# Patient Record
Sex: Male | Born: 1975 | Race: White | Hispanic: No | Marital: Married | Smoking: Never smoker
Health system: Southern US, Community
[De-identification: ages and names within clinical notes are randomized; demographics above are authoritative.]

## PROBLEM LIST (undated history)

## (undated) DIAGNOSIS — F909 Attention-deficit hyperactivity disorder, unspecified type: Secondary | ICD-10-CM

---

## 2007-10-07 ENCOUNTER — Encounter: Admission: RE | Admit: 2007-10-07 | Discharge: 2007-10-07 | Payer: Self-pay | Admitting: Family Medicine

## 2015-10-30 ENCOUNTER — Encounter (HOSPITAL_COMMUNITY): Payer: Self-pay | Admitting: Emergency Medicine

## 2015-10-30 ENCOUNTER — Emergency Department (HOSPITAL_COMMUNITY): Payer: BC Managed Care – PPO

## 2015-10-30 ENCOUNTER — Inpatient Hospital Stay (HOSPITAL_COMMUNITY)
Admission: EM | Admit: 2015-10-30 | Discharge: 2015-11-02 | DRG: 340 | Disposition: A | Discharge status: Home / Self Care | Payer: BC Managed Care – PPO | Attending: Surgery | Admitting: Surgery

## 2015-10-30 DIAGNOSIS — K3580 Unspecified acute appendicitis: Secondary | ICD-10-CM | POA: Diagnosis present

## 2015-10-30 DIAGNOSIS — F909 Attention-deficit hyperactivity disorder, unspecified type: Secondary | ICD-10-CM | POA: Diagnosis present

## 2015-10-30 DIAGNOSIS — K353 Acute appendicitis with localized peritonitis, without perforation or gangrene: Secondary | ICD-10-CM

## 2015-10-30 DIAGNOSIS — K37 Unspecified appendicitis: Secondary | ICD-10-CM | POA: Diagnosis present

## 2015-10-30 DIAGNOSIS — Z79899 Other long term (current) drug therapy: Secondary | ICD-10-CM

## 2015-10-30 HISTORY — DX: Attention-deficit hyperactivity disorder, unspecified type: F90.9

## 2015-10-30 LAB — URINALYSIS, ROUTINE W REFLEX MICROSCOPIC
BILIRUBIN URINE: NEGATIVE
Glucose, UA: NEGATIVE mg/dL
HGB URINE DIPSTICK: NEGATIVE
Ketones, ur: 15 mg/dL — AB
Leukocytes, UA: NEGATIVE
Nitrite: NEGATIVE
PH: 6.5 (ref 5.0–8.0)
Protein, ur: NEGATIVE mg/dL
SPECIFIC GRAVITY, URINE: 1.025 (ref 1.005–1.030)

## 2015-10-30 LAB — COMPREHENSIVE METABOLIC PANEL
ALBUMIN: 4.5 g/dL (ref 3.5–5.0)
ALT: 18 U/L (ref 17–63)
ANION GAP: 9 (ref 5–15)
AST: 18 U/L (ref 15–41)
Alkaline Phosphatase: 55 U/L (ref 38–126)
BUN: 9 mg/dL (ref 6–20)
CHLORIDE: 101 mmol/L (ref 101–111)
CO2: 27 mmol/L (ref 22–32)
Calcium: 9.9 mg/dL (ref 8.9–10.3)
Creatinine, Ser: 1.13 mg/dL (ref 0.61–1.24)
GFR calc non Af Amer: >60 mL/min (ref >60)
GLUCOSE: 123 mg/dL — AB (ref 65–99)
POTASSIUM: 3.6 mmol/L (ref 3.5–5.1)
SODIUM: 137 mmol/L (ref 135–145)
Total Bilirubin: 1.1 mg/dL (ref 0.3–1.2)
Total Protein: 7.1 g/dL (ref 6.5–8.1)

## 2015-10-30 LAB — CBC
HEMATOCRIT: 43.3 % (ref 39.0–52.0)
HEMOGLOBIN: 14.9 g/dL (ref 13.0–17.0)
MCH: 32 pg (ref 26.0–34.0)
MCHC: 34.4 g/dL (ref 30.0–36.0)
MCV: 93.1 fL (ref 78.0–100.0)
Platelets: 241 10*3/uL (ref 150–400)
RBC: 4.65 MIL/uL (ref 4.22–5.81)
RDW: 11.9 % (ref 11.5–15.5)
WBC: 12.7 10*3/uL — ABNORMAL HIGH (ref 4.0–10.5)

## 2015-10-30 LAB — LIPASE, BLOOD: LIPASE: 22 U/L (ref 11–51)

## 2015-10-30 MED ORDER — IOPAMIDOL (ISOVUE-300) INJECTION 61%
INTRAVENOUS | Status: AC
  Administered 2015-10-30: 100 mL
  Filled 2015-10-30: qty 100

## 2015-10-30 NOTE — ED Provider Notes (Signed)
Complains of right lower quadrant pain. Pain originated at periumbilical area 2 days ago and has since migrated to right lower quadrant. No vomiting. He's had slightly diminished appetite today. No urinary symptoms no treatment prior to coming here. Pain worse with pressing on the area not improved by anything. Pain is moderate at present. Patient declines pain medicine on exam he is alert nontoxic abdomen nondistended, tender at right lower quadrant. Genitalia normal   Orlie Dakin, MD 10/30/15 2239

## 2015-10-30 NOTE — ED Provider Notes (Signed)
[facility] DEPT Provider Note   CSN: [id number] Arrival date & time: 10/30/15  2129    History   Chief Complaint Chief Complaint  Patient presents with  . Abdominal Pain    HPI Brendan Fowler is a 40 y.o. male.  Patient presents with two-day history of gradually worsening abdominal pain starting in the umbilical area and having now moved to the right lower quadrant. Worse with palpation and movement. Patient has had a low-grade fever at home this evening to 100F, prompting emergency department visit. Patient is hungry but states decreased intake at dinnertime. Last oral intake was around 7:45 PM. No urinary symptoms. Mild constipation, no diarrhea. No history of abdominal surgeries. The onset of this condition was acute. The course is constant. Alleviating factors: none.        Past Medical History:  Diagnosis Date  . ADHD (attention deficit hyperactivity disorder)     There are no active problems to display for this patient.   History reviewed. No pertinent surgical history.     Home Medications    Prior to Admission medications   Medication Sig Start Date End Date Taking? Authorizing Provider  amphetamine-dextroamphetamine (ADDERALL XR) 20 MG 24 hr capsule Take 20 mg by mouth daily. 10/11/15  Yes Historical Provider, MD    Family History History reviewed. No pertinent family history.  Social History Social History  Substance Use Topics  . Smoking status: Never Smoker  . Smokeless tobacco: Never Used  . Alcohol use Yes     Comment: occasional     Allergies   Review of patient's allergies indicates no known allergies.   Review of Systems Review of Systems  Constitutional: Positive for fever.  HENT: Negative for rhinorrhea and sore throat.   Eyes: Negative for redness.  Respiratory: Negative for cough.   Cardiovascular: Negative for chest pain.  Gastrointestinal: Positive for abdominal pain. Negative for diarrhea, nausea and vomiting.    Genitourinary: Negative for dysuria.  Musculoskeletal: Negative for myalgias.  Skin: Negative for rash.  Neurological: Negative for headaches.     Physical Exam Updated Vital Signs BP 151/75 (BP Location: Left Arm)   Pulse 102   Temp 99.5 F (37.5 C) (Oral)   Resp 18   SpO2 100%   Physical Exam  Constitutional: He appears well-developed and well-nourished.  HENT:  Head: Normocephalic and atraumatic.  Eyes: Conjunctivae are normal. Right eye exhibits no discharge. Left eye exhibits no discharge.  Neck: Normal range of motion. Neck supple.  Cardiovascular: Normal rate, regular rhythm and normal heart sounds.   Pulmonary/Chest: Effort normal and breath sounds normal. No respiratory distress. He has no wheezes. He has no rales.  Abdominal: Soft. He exhibits no distension and no mass. There is tenderness. There is no rebound and no guarding.  + Psoas, + Rovsings, - Obturator  Neurological: He is alert.  Skin: Skin is warm and dry.  Psychiatric: He has a normal mood and affect.  Nursing note and vitals reviewed.    ED Treatments / Results  Labs (all labs ordered are listed, but only abnormal results are displayed) Labs Reviewed  COMPREHENSIVE METABOLIC PANEL - Abnormal; Notable for the following:       Result Value   Glucose, Bld 123 (*)    All other components within normal limits  CBC - Abnormal; Notable for the following:    WBC 12.7 (*)    All other components within normal limits  URINALYSIS, ROUTINE W REFLEX MICROSCOPIC (NOT AT [facility]) - Abnormal; Notable  for the following:    Ketones, ur 15 (*)    All other components within normal limits  LIPASE, BLOOD    EKG  EKG Interpretation None       Radiology Ct Abdomen Pelvis W Contrast  Result Date: 10/31/2015 CLINICAL DATA:  Acute onset of right lower quadrant abdominal pain, chills and fever. Nausea. Initial encounter. EXAM: CT ABDOMEN AND PELVIS WITH CONTRAST TECHNIQUE: Multidetector CT imaging of the abdomen  and pelvis was performed using the standard protocol following bolus administration of intravenous contrast. CONTRAST:  175mL ISOVUE-300 IOPAMIDOL (ISOVUE-300) INJECTION 61% COMPARISON:  None. FINDINGS: The visualized lung bases are clear. The liver and spleen are unremarkable in appearance. The gallbladder is within normal limits. The pancreas and adrenal glands are unremarkable. The kidneys are unremarkable in appearance. There is no evidence of hydronephrosis. No renal or ureteral stones are seen. No perinephric stranding is appreciated. Trace fluid within the pelvis likely reflects the underlying appendiceal process. The small bowel is unremarkable in appearance. The stomach is within normal limits. No acute vascular abnormalities are seen. The appendix is dilated to 1.6 cm, with surrounding soft tissue inflammation and trace free fluid, concerning for acute appendicitis. An appendicolith is noted at the base of the appendix, measuring 1.7 cm. There is no evidence of perforation or abscess formation at this time. The appendix is retrocecal in nature. The colon is partially filled with fluid, and is unremarkable in appearance. The bladder is largely decompressed and not well assessed. The prostate remains normal in size, with scattered calcification. No inguinal lymphadenopathy is seen. No acute osseous abnormalities are identified. Vacuum phenomenon is noted at L5-S1. IMPRESSION: Acute appendicitis, with dilatation of the appendix to 1.6 cm, surrounding soft inflammation and trace free fluid. Appendicolith noted at the base of the appendix, measuring 1.7 cm. No evidence of perforation or abscess formation at this time. The appendix is retrocecal in nature. These results were called by telephone at the time of interpretation on 10/31/2015 at 12:03 am to [facility] PA, who verbally acknowledged these results. Electronically Signed   By: Garald Balding M.D.   On: 10/31/2015 00:05    Procedures Procedures  (including critical care time)  Medications Ordered in ED Medications - No data to display   Initial Impression / Assessment and Plan / ED Course  I have reviewed the triage vital signs and the nursing notes.  Pertinent labs & imaging results that were available during my care of the patient were reviewed by me and considered in my medical decision making (see chart for details).  Clinical Course   10:10 PM Patient seen and examined. CT ordered. Suspect appendicitis. Discussed with Dr. Winfred Leeds who will see.   Vital signs reviewed and are as follows: BP 151/75 (BP Location: Left Arm)   Pulse 102   Temp 99.5 F (37.5 C) (Oral)   Resp 18   SpO2 100%   12:11 AM + appendicitis -- no complications. Abx ordered. Dr. Donne Hazel in surgery with trauma patient.   Final Clinical Impressions(s) / ED Diagnoses   Final diagnoses:  Acute appendicitis with localized peritonitis   Admit.   New Prescriptions New Prescriptions   No medications on file     Carlisle Cater, PA-C 10/31/15 0013    Orlie Dakin, MD 10/31/15 [phone]

## 2015-10-30 NOTE — ED Notes (Signed)
Patient transported to CT 

## 2015-10-30 NOTE — ED Triage Notes (Signed)
Right lower quadrant pain for two days that has gradually worsened. Had chills/fever today (100.8 at home). Nausea present, no vomiting.

## 2015-10-30 NOTE — ED Notes (Signed)
Patient returned from CT

## 2015-10-31 ENCOUNTER — Inpatient Hospital Stay (HOSPITAL_COMMUNITY): Payer: BC Managed Care – PPO | Admitting: Certified Registered Nurse Anesthetist

## 2015-10-31 ENCOUNTER — Encounter (HOSPITAL_COMMUNITY): Payer: Self-pay | Admitting: Certified Registered Nurse Anesthetist

## 2015-10-31 ENCOUNTER — Encounter (HOSPITAL_COMMUNITY): Admission: EM | Disposition: A | Discharge status: Home / Self Care | Payer: Self-pay | Source: Home / Self Care

## 2015-10-31 DIAGNOSIS — F909 Attention-deficit hyperactivity disorder, unspecified type: Secondary | ICD-10-CM | POA: Diagnosis present

## 2015-10-31 DIAGNOSIS — K37 Unspecified appendicitis: Secondary | ICD-10-CM | POA: Diagnosis present

## 2015-10-31 DIAGNOSIS — K353 Acute appendicitis with localized peritonitis: Secondary | ICD-10-CM | POA: Diagnosis present

## 2015-10-31 DIAGNOSIS — Z79899 Other long term (current) drug therapy: Secondary | ICD-10-CM | POA: Diagnosis not present

## 2015-10-31 HISTORY — PX: LAPAROSCOPIC APPENDECTOMY: SHX408

## 2015-10-31 HISTORY — PX: APPENDECTOMY: SHX54

## 2015-10-31 LAB — SURGICAL PCR SCREEN
MRSA, PCR: NEGATIVE
Staphylococcus aureus: NEGATIVE

## 2015-10-31 SURGERY — APPENDECTOMY, LAPAROSCOPIC
Anesthesia: General | Site: Abdomen

## 2015-10-31 MED ORDER — MEPERIDINE HCL 25 MG/ML IJ SOLN: 6.2500 mg | INTRAMUSCULAR | Status: DC | PRN

## 2015-10-31 MED ORDER — 0.9 % SODIUM CHLORIDE (POUR BTL) OPTIME
TOPICAL | Status: DC | PRN
  Administered 2015-10-31: 1000 mL

## 2015-10-31 MED ORDER — HYDROMORPHONE HCL 1 MG/ML IJ SOLN
0.5000 mg | Freq: Once | INTRAMUSCULAR | Status: AC
  Administered 2015-10-31: 0.5 mg via INTRAVENOUS
  Filled 2015-10-31: qty 1

## 2015-10-31 MED ORDER — ACETAMINOPHEN 325 MG PO TABS: 650.0000 mg | ORAL_TABLET | Freq: Four times a day (QID) | ORAL | Status: DC | PRN

## 2015-10-31 MED ORDER — MIDAZOLAM HCL 2 MG/2ML IJ SOLN
INTRAMUSCULAR | Status: AC
  Filled 2015-10-31: qty 2

## 2015-10-31 MED ORDER — FENTANYL CITRATE (PF) 100 MCG/2ML IJ SOLN
INTRAMUSCULAR | Status: DC | PRN
  Administered 2015-10-31: 50 ug via INTRAVENOUS
  Administered 2015-10-31: 100 ug via INTRAVENOUS
  Administered 2015-10-31: 50 ug via INTRAVENOUS

## 2015-10-31 MED ORDER — HYDROMORPHONE HCL 1 MG/ML IJ SOLN
INTRAMUSCULAR | Status: AC
  Administered 2015-10-31: 0.5 mg via INTRAVENOUS
  Filled 2015-10-31: qty 1

## 2015-10-31 MED ORDER — LACTATED RINGERS IV SOLN
INTRAVENOUS | Status: DC | PRN
  Administered 2015-10-31: 10:00:00 via INTRAVENOUS

## 2015-10-31 MED ORDER — OXYCODONE-ACETAMINOPHEN 5-325 MG PO TABS
1.0000 | ORAL_TABLET | ORAL | Status: DC | PRN
  Administered 2015-10-31 – 2015-11-02 (×10): 2 via ORAL
  Filled 2015-10-31 (×10): qty 2

## 2015-10-31 MED ORDER — ONDANSETRON 4 MG PO TBDP
4.0000 mg | ORAL_TABLET | Freq: Four times a day (QID) | ORAL | Status: DC | PRN
  Filled 2015-10-31: qty 1

## 2015-10-31 MED ORDER — ONDANSETRON HCL 4 MG/2ML IJ SOLN
4.0000 mg | Freq: Once | INTRAMUSCULAR | Status: AC
  Administered 2015-10-31: 4 mg via INTRAVENOUS
  Filled 2015-10-31: qty 2

## 2015-10-31 MED ORDER — SUGAMMADEX SODIUM 200 MG/2ML IV SOLN
INTRAVENOUS | Status: DC | PRN
  Administered 2015-10-31: 200 mg via INTRAVENOUS

## 2015-10-31 MED ORDER — CHLORHEXIDINE GLUCONATE CLOTH 2 % EX PADS: 6.0000 | MEDICATED_PAD | Freq: Once | CUTANEOUS | Status: DC

## 2015-10-31 MED ORDER — ROCURONIUM BROMIDE 100 MG/10ML IV SOLN
INTRAVENOUS | Status: DC | PRN
  Administered 2015-10-31: 50 mg via INTRAVENOUS

## 2015-10-31 MED ORDER — MIDAZOLAM HCL 5 MG/5ML IJ SOLN
INTRAMUSCULAR | Status: DC | PRN
  Administered 2015-10-31: 2 mg via INTRAVENOUS

## 2015-10-31 MED ORDER — METRONIDAZOLE IN NACL 5-0.79 MG/ML-% IV SOLN
500.0000 mg | Freq: Once | INTRAVENOUS | Status: AC
  Administered 2015-10-31: 500 mg via INTRAVENOUS
  Filled 2015-10-31: qty 100

## 2015-10-31 MED ORDER — LIDOCAINE HCL (CARDIAC) 20 MG/ML IV SOLN
INTRAVENOUS | Status: DC | PRN
  Administered 2015-10-31: 60 mg via INTRAVENOUS

## 2015-10-31 MED ORDER — ONDANSETRON HCL 4 MG/2ML IJ SOLN
INTRAMUSCULAR | Status: DC | PRN
  Administered 2015-10-31: 4 mg via INTRAVENOUS

## 2015-10-31 MED ORDER — ACETAMINOPHEN 650 MG RE SUPP: 650.0000 mg | Freq: Four times a day (QID) | RECTAL | Status: DC | PRN

## 2015-10-31 MED ORDER — PROPOFOL 10 MG/ML IV BOLUS
INTRAVENOUS | Status: DC | PRN
  Administered 2015-10-31: 150 mg via INTRAVENOUS

## 2015-10-31 MED ORDER — METOCLOPRAMIDE HCL 5 MG/ML IJ SOLN: 10.0000 mg | Freq: Once | INTRAMUSCULAR | Status: DC | PRN

## 2015-10-31 MED ORDER — FENTANYL CITRATE (PF) 250 MCG/5ML IJ SOLN
INTRAMUSCULAR | Status: AC
  Filled 2015-10-31: qty 5

## 2015-10-31 MED ORDER — LACTATED RINGERS IV SOLN: INTRAVENOUS | Status: DC

## 2015-10-31 MED ORDER — SODIUM CHLORIDE 0.9 % IV SOLN
INTRAVENOUS | Status: DC
  Administered 2015-10-31 – 2015-11-01 (×3): via INTRAVENOUS

## 2015-10-31 MED ORDER — ONDANSETRON HCL 4 MG/2ML IJ SOLN
4.0000 mg | Freq: Four times a day (QID) | INTRAMUSCULAR | Status: DC | PRN
  Administered 2015-11-01: 4 mg via INTRAVENOUS
  Filled 2015-10-31: qty 2

## 2015-10-31 MED ORDER — HYDROMORPHONE HCL 1 MG/ML IJ SOLN
0.2500 mg | INTRAMUSCULAR | Status: DC | PRN
  Administered 2015-10-31 (×4): 0.5 mg via INTRAVENOUS

## 2015-10-31 MED ORDER — BUPIVACAINE-EPINEPHRINE (PF) 0.5% -1:200000 IJ SOLN
INTRAMUSCULAR | Status: AC
  Filled 2015-10-31: qty 30

## 2015-10-31 MED ORDER — BUPIVACAINE-EPINEPHRINE 0.5% -1:200000 IJ SOLN
INTRAMUSCULAR | Status: DC | PRN
  Administered 2015-10-31: 5 mL

## 2015-10-31 MED ORDER — ONDANSETRON HCL 4 MG/2ML IJ SOLN
INTRAMUSCULAR | Status: AC
  Filled 2015-10-31: qty 2

## 2015-10-31 MED ORDER — METRONIDAZOLE IN NACL 5-0.79 MG/ML-% IV SOLN
500.0000 mg | Freq: Three times a day (TID) | INTRAVENOUS | Status: DC
  Administered 2015-10-31 – 2015-11-02 (×7): 500 mg via INTRAVENOUS
  Filled 2015-10-31 (×8): qty 100

## 2015-10-31 MED ORDER — SODIUM CHLORIDE 0.9 % IR SOLN
Status: DC | PRN
  Administered 2015-10-31: 1000 mL

## 2015-10-31 MED ORDER — MORPHINE SULFATE (PF) 2 MG/ML IV SOLN
2.0000 mg | INTRAVENOUS | Status: DC | PRN
  Administered 2015-10-31 – 2015-11-01 (×5): 2 mg via INTRAVENOUS
  Filled 2015-10-31 (×5): qty 1

## 2015-10-31 MED ORDER — PROPOFOL 10 MG/ML IV BOLUS
INTRAVENOUS | Status: AC
  Filled 2015-10-31: qty 20

## 2015-10-31 MED ORDER — ENOXAPARIN SODIUM 40 MG/0.4ML ~~LOC~~ SOLN
40.0000 mg | SUBCUTANEOUS | Status: DC
  Administered 2015-11-01 – 2015-11-02 (×2): 40 mg via SUBCUTANEOUS
  Filled 2015-10-31 (×2): qty 0.4

## 2015-10-31 MED ORDER — DEXTROSE 5 % IV SOLN
2.0000 g | Freq: Once | INTRAVENOUS | Status: AC
  Administered 2015-10-31: 2 g via INTRAVENOUS
  Filled 2015-10-31: qty 2

## 2015-10-31 SURGICAL SUPPLY — 41 items
APPLIER CLIP ROT 10 11.4 M/L (STAPLE)
BLADE SURG ROTATE 9660 (MISCELLANEOUS) IMPLANT
CANISTER SUCTION 2500CC (MISCELLANEOUS) ×3 IMPLANT
CHLORAPREP W/TINT 26ML (MISCELLANEOUS) ×3 IMPLANT
CLIP APPLIE ROT 10 11.4 M/L (STAPLE) IMPLANT
COVER SURGICAL LIGHT HANDLE (MISCELLANEOUS) ×3 IMPLANT
CUTTER FLEX LINEAR 45M (STAPLE) ×3 IMPLANT
DRAPE WARM FLUID 44X44 (DRAPE) ×3 IMPLANT
ELECT REM PT RETURN 9FT ADLT (ELECTROSURGICAL) ×3
ELECTRODE REM PT RTRN 9FT ADLT (ELECTROSURGICAL) ×1 IMPLANT
ENDOLOOP SUT PDS II  0 18 (SUTURE)
ENDOLOOP SUT PDS II 0 18 (SUTURE) IMPLANT
GLOVE BIO SURGEON STRL SZ8 (GLOVE) ×3 IMPLANT
GLOVE BIOGEL PI IND STRL 7.0 (GLOVE) ×1 IMPLANT
GLOVE BIOGEL PI IND STRL 8 (GLOVE) ×1 IMPLANT
GLOVE BIOGEL PI INDICATOR 7.0 (GLOVE) ×2
GLOVE BIOGEL PI INDICATOR 8 (GLOVE) ×2
GLOVE ECLIPSE 6.5 STRL STRAW (GLOVE) ×3 IMPLANT
GOWN STRL REUS W/ TWL LRG LVL3 (GOWN DISPOSABLE) ×2 IMPLANT
GOWN STRL REUS W/ TWL XL LVL3 (GOWN DISPOSABLE) ×1 IMPLANT
GOWN STRL REUS W/TWL LRG LVL3 (GOWN DISPOSABLE) ×4
GOWN STRL REUS W/TWL XL LVL3 (GOWN DISPOSABLE) ×2
KIT BASIN OR (CUSTOM PROCEDURE TRAY) ×3 IMPLANT
KIT ROOM TURNOVER OR (KITS) ×3 IMPLANT
LIQUID BAND (GAUZE/BANDAGES/DRESSINGS) ×3 IMPLANT
NS IRRIG 1000ML POUR BTL (IV SOLUTION) ×3 IMPLANT
PAD ARMBOARD 7.5X6 YLW CONV (MISCELLANEOUS) ×6 IMPLANT
POUCH SPECIMEN RETRIEVAL 10MM (ENDOMECHANICALS) ×6 IMPLANT
RELOAD STAPLE TA45 3.5 REG BLU (ENDOMECHANICALS) ×3 IMPLANT
SCALPEL HARMONIC ACE (MISCELLANEOUS) ×3 IMPLANT
SET IRRIG TUBING LAPAROSCOPIC (IRRIGATION / IRRIGATOR) ×3 IMPLANT
SPECIMEN JAR SMALL (MISCELLANEOUS) ×3 IMPLANT
SUT MON AB 4-0 PC3 18 (SUTURE) ×6 IMPLANT
SUT VICRYL 0 UR6 27IN ABS (SUTURE) ×3 IMPLANT
TOWEL OR 17X24 6PK STRL BLUE (TOWEL DISPOSABLE) ×3 IMPLANT
TOWEL OR 17X26 10 PK STRL BLUE (TOWEL DISPOSABLE) ×3 IMPLANT
TRAY FOLEY CATH 16FR SILVER (SET/KITS/TRAYS/PACK) ×3 IMPLANT
TRAY LAPAROSCOPIC MC (CUSTOM PROCEDURE TRAY) ×3 IMPLANT
TROCAR XCEL BLADELESS 5X75MML (TROCAR) ×6 IMPLANT
TROCAR XCEL BLUNT TIP 100MML (ENDOMECHANICALS) ×3 IMPLANT
TUBING INSUFFLATION (TUBING) ×3 IMPLANT

## 2015-10-31 NOTE — Transfer of Care (Signed)
Immediate Anesthesia Transfer of Care Note  Patient: Brendan Fowler  Procedure(s) Performed: Procedure(s): APPENDECTOMY LAPAROSCOPIC (N/A)  Patient Location: PACU  Anesthesia Type:General  Level of Consciousness: awake, alert , oriented and patient cooperative  Airway & Oxygen Therapy: Patient Spontanous Breathing and Patient connected to nasal cannula oxygen  Post-op Assessment: Report given to RN, Post -op Vital signs reviewed and stable and Patient moving all extremities X 4  Post vital signs: Reviewed and stable  Last Vitals:  Vitals:   10/31/15 0322 10/31/15 1112  BP: 123/65   Pulse: 84   Resp: 16   Temp: 37.7 C 36.7 C    Last Pain:  Vitals:   10/31/15 1112  TempSrc:   PainSc: 7          Complications: No apparent anesthesia complications

## 2015-10-31 NOTE — Anesthesia Postprocedure Evaluation (Signed)
Anesthesia Post Note  Patient: Pete Ciolli  Procedure(s) Performed: Procedure(s) (LRB): APPENDECTOMY LAPAROSCOPIC (N/A)  Patient location during evaluation: PACU Anesthesia Type: General Level of consciousness: awake and alert Pain management: pain level controlled Vital Signs Assessment: post-procedure vital signs reviewed and stable Respiratory status: spontaneous breathing, nonlabored ventilation and respiratory function stable Cardiovascular status: blood pressure returned to baseline and stable Postop Assessment: no signs of nausea or vomiting Anesthetic complications: no    Last Vitals:  Vitals:   10/31/15 1127 10/31/15 1142  BP: 124/79 124/83  Pulse: (!) 57 (!) 54  Resp: 15 15  Temp:      Last Pain:  Vitals:   10/31/15 1140  TempSrc:   PainSc: 7                  Nilda Simmer

## 2015-10-31 NOTE — Anesthesia Preprocedure Evaluation (Addendum)
Anesthesia Evaluation  Patient identified by MRN, date of birth, ID band Patient awake    Reviewed: Allergy & Precautions, NPO status , Patient's Chart, lab work & pertinent test results  Airway Mallampati: II  TM Distance: >3 FB Neck ROM: Full    Dental no notable dental hx. (+) Teeth Intact, Dental Advisory Given   Pulmonary neg pulmonary ROS,    Pulmonary exam normal breath sounds clear to auscultation       Cardiovascular negative cardio ROS Normal cardiovascular exam Rhythm:Regular Rate:Normal     Neuro/Psych PSYCHIATRIC DISORDERS ADHDnegative neurological ROS     GI/Hepatic negative GI ROS, Neg liver ROS,   Endo/Other  negative endocrine ROS  Renal/GU negative Renal ROS  negative genitourinary   Musculoskeletal negative musculoskeletal ROS (+)   Abdominal (+)  Abdomen: tender.    Peds  Hematology negative hematology ROS (+)   Anesthesia Other Findings   Reproductive/Obstetrics negative OB ROS                            Anesthesia Physical Anesthesia Plan  ASA: II and emergent  Anesthesia Plan: General   Post-op Pain Management:    Induction: Intravenous  Airway Management Planned: Oral ETT  Additional Equipment:   Intra-op Plan:   Post-operative Plan: Extubation in OR  Informed Consent: I have reviewed the patients History and Physical, chart, labs and discussed the procedure including the risks, benefits and alternatives for the proposed anesthesia with the patient or authorized representative who has indicated his/her understanding and acceptance.   Dental advisory given  Plan Discussed with: Anesthesiologist, Surgeon and CRNA  Anesthesia Plan Comments:         Anesthesia Quick Evaluation

## 2015-10-31 NOTE — Progress Notes (Signed)
Pt tolerated clear liquid tray so was advanced to soft diet for dinner. Has had no nausea or vomiting- tolerating PO pain medication as well. He has ambulated around his room and to the bathroom, and sat up in chair this afternoon. Pt and wife educated on importance of early ambulation and IS. Will continue to monitor.   [place], Jerry Caras

## 2015-10-31 NOTE — Progress Notes (Signed)
Day of Surgery  Subjective: Pt has RLQ pain  Objective: Vital signs in last 24 hours: Temp:  [99.5 F (37.5 C)-99.8 F (37.7 C)] 99.8 F (37.7 C) (08/14 0322) Pulse Rate:  [74-102] 84 (08/14 0322) Resp:  [16-18] 16 (08/14 0322) BP: (123-151)/(65-81) 123/65 (08/14 0322) SpO2:  [100 %] 100 % (08/14 0322) Weight:  [83.8 kg (184 lb 11.2 oz)] 83.8 kg (184 lb 11.2 oz) (08/14 0322) Last BM Date: 10/30/15  Intake/Output from previous day: No intake/output data recorded. Intake/Output this shift: Total I/O In: 437.5 [I.V.:337.5; IV Piggyback:100] Out: -   GI: tender RLQ abdomen   Lab Results:   Recent Labs  10/30/15 2142  WBC 12.7*  HGB 14.9  HCT 43.3  PLT 241   BMET  Recent Labs  10/30/15 2142  NA 137  K 3.6  CL 101  CO2 27  GLUCOSE 123*  BUN 9  CREATININE 1.13  CALCIUM 9.9   PT/INR No results for input(s): LABPROT, INR in the last 72 hours. ABG No results for input(s): PHART, HCO3 in the last 72 hours.  Invalid input(s): PCO2, PO2  Studies/Results: Ct Abdomen Pelvis W Contrast  Result Date: 10/31/2015 CLINICAL DATA:  Acute onset of right lower quadrant abdominal pain, chills and fever. Nausea. Initial encounter. EXAM: CT ABDOMEN AND PELVIS WITH CONTRAST TECHNIQUE: Multidetector CT imaging of the abdomen and pelvis was performed using the standard protocol following bolus administration of intravenous contrast. CONTRAST:  163mL ISOVUE-300 IOPAMIDOL (ISOVUE-300) INJECTION 61% COMPARISON:  None. FINDINGS: The visualized lung bases are clear. The liver and spleen are unremarkable in appearance. The gallbladder is within normal limits. The pancreas and adrenal glands are unremarkable. The kidneys are unremarkable in appearance. There is no evidence of hydronephrosis. No renal or ureteral stones are seen. No perinephric stranding is appreciated. Trace fluid within the pelvis likely reflects the underlying appendiceal process. The small bowel is unremarkable in  appearance. The stomach is within normal limits. No acute vascular abnormalities are seen. The appendix is dilated to 1.6 cm, with surrounding soft tissue inflammation and trace free fluid, concerning for acute appendicitis. An appendicolith is noted at the base of the appendix, measuring 1.7 cm. There is no evidence of perforation or abscess formation at this time. The appendix is retrocecal in nature. The colon is partially filled with fluid, and is unremarkable in appearance. The bladder is largely decompressed and not well assessed. The prostate remains normal in size, with scattered calcification. No inguinal lymphadenopathy is seen. No acute osseous abnormalities are identified. Vacuum phenomenon is noted at L5-S1. IMPRESSION: Acute appendicitis, with dilatation of the appendix to 1.6 cm, surrounding soft inflammation and trace free fluid. Appendicolith noted at the base of the appendix, measuring 1.7 cm. No evidence of perforation or abscess formation at this time. The appendix is retrocecal in nature. These results were called by telephone at the time of interpretation on 10/31/2015 at 12:03 am to [facility] PA, who verbally acknowledged these results. Electronically Signed   By: Garald Balding M.D.   On: 10/31/2015 00:05    Anti-infectives: Anti-infectives    Start     Dose/Rate Route Frequency Ordered Stop   10/31/15 0800  metroNIDAZOLE (FLAGYL) IVPB 500 mg     500 mg 100 mL/hr over 60 Minutes Intravenous Every 8 hours 10/31/15 0325     10/31/15 0015  cefTRIAXone (ROCEPHIN) 2 g in dextrose 5 % 50 mL IVPB     2 g 100 mL/hr over 30 Minutes Intravenous  Once 10/31/15 0010 10/31/15 0044   10/31/15 0015  metroNIDAZOLE (FLAGYL) IVPB 500 mg     500 mg 100 mL/hr over 60 Minutes Intravenous  Once 10/31/15 0010 10/31/15 0143      Assessment/Plan: Acute appendicitis Discussed medical and surgical treatments of appendicitis The pro and cons of each discussed  Pt has chosen laparoscopic  appendectomy The procedure has been discussed with the patient.  Alternative therapies have been discussed with the patient.  Operative risks include bleeding,  Infection,  Organ injury,  Nerve injury,  Bowel injury ,  abscess Blood vessel injury,  DVT,  Pulmonary embolism,  Death,  And possible reoperation.  Medical management risks include worsening of present situation.  The success of the procedure is 50 -100 % at treating patients symptoms.  The patient understands and agrees to proceed.   LOS: 0 days    Emerick Weatherly A. 10/31/2015

## 2015-10-31 NOTE — Anesthesia Procedure Notes (Signed)
Procedure Name: Intubation Date/Time: 10/31/2015 9:58 AM Performed by: Carney Living Pre-anesthesia Checklist: Patient identified, Emergency Drugs available, Suction available, Patient being monitored and Timeout performed Patient Re-evaluated:Patient Re-evaluated prior to inductionOxygen Delivery Method: Circle system utilized Preoxygenation: Pre-oxygenation with 100% oxygen Intubation Type: IV induction Ventilation: Mask ventilation without difficulty Laryngoscope Size: Mac and 4 Grade View: Grade I Tube type: Oral Tube size: 7.5 mm Number of attempts: 1 Airway Equipment and Method: Stylet Placement Confirmation: ETT inserted through vocal cords under direct vision,  positive ETCO2 and breath sounds checked- equal and bilateral Secured at: 23 cm Tube secured with: Tape Dental Injury: Teeth and Oropharynx as per pre-operative assessment

## 2015-10-31 NOTE — Brief Op Note (Signed)
10/30/2015 - 10/31/2015  10:57 AM  PATIENT:  Brendan Fowler  40 y.o. male  PRE-OPERATIVE DIAGNOSIS:  Acute Appendicitis  POST-OPERATIVE DIAGNOSIS:  Acute Appendicitis  PROCEDURE:  Procedure(s): APPENDECTOMY LAPAROSCOPIC (N/A)  SURGEON:  Surgeon(s) and Role:    Erroll Luna, MD - Primary    ASSISTANTS: none   ANESTHESIA:   local and general  EBL:  Total I/O In: 437.5 [I.V.:337.5; IV Piggyback:100] Out: -   BLOOD ADMINISTERED:none  DRAINS: none   LOCAL MEDICATIONS USED:  BUPIVICAINE   SPECIMEN:  Source of Specimen:  appendix  DISPOSITION OF SPECIMEN:  PATHOLOGY  COUNTS:  YES  TOURNIQUET:  * No tourniquets in log *  DICTATION: .Other Dictation: Dictation Number [id number]  PLAN OF CARE: Admit to inpatient   PATIENT DISPOSITION:  PACU - hemodynamically stable.   Delay start of Pharmacological VTE agent (>24hrs) due to surgical blood loss or risk of bleeding: no

## 2015-10-31 NOTE — H&P (Signed)
Brendan Fowler is an 40 y.o. male.   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  No prior medical history.  Past Medical History:  Diagnosis Date  . ADHD (attention deficit hyperactivity disorder)     History reviewed. No pertinent surgical history.  History reviewed. No pertinent family history. Social History:  reports that he has never smoked. He has never used smokeless tobacco. He reports that he drinks alcohol. He reports that he does not use drugs.  Allergies: No Known Allergies  meds adderall  Results for orders placed or performed during the hospital encounter of 10/30/15 (from the past 48 hour(s))  Lipase, blood     Status: None   Collection Time: 10/30/15  9:42 PM  Result Value Ref Range   Lipase 22 11 - 51 U/L  Comprehensive metabolic panel     Status: Abnormal   Collection Time: 10/30/15  9:42 PM  Result Value Ref Range   Sodium 137 135 - 145 mmol/L   Potassium 3.6 3.5 - 5.1 mmol/L   Chloride 101 101 - 111 mmol/L   CO2 27 22 - 32 mmol/L   Glucose, Bld 123 (H) 65 - 99 mg/dL   BUN 9 6 - 20 mg/dL   Creatinine, Ser 1.13 0.61 - 1.24 mg/dL   Calcium 9.9 8.9 - 10.3 mg/dL   Total Protein 7.1 6.5 - 8.1 g/dL   Albumin 4.5 3.5 - 5.0 g/dL   AST 18 15 - 41 U/L   ALT 18 17 - 63 U/L   Alkaline Phosphatase 55 38 - 126 U/L   Total Bilirubin 1.1 0.3 - 1.2 mg/dL   GFR calc non Af Amer >60 >60 mL/min   GFR calc Af Amer >60 >60 mL/min    Comment: (NOTE) The eGFR has been calculated using the CKD EPI equation. This calculation has not been validated in all clinical situations. eGFR's persistently <60 mL/min signify possible Chronic Kidney Disease.    Anion gap 9 5 - 15  CBC     Status: Abnormal   Collection Time: 10/30/15  9:42 PM  Result Value Ref Range   WBC 12.7 (H) 4.0 - 10.5 K/uL   RBC 4.65 4.22 - 5.81 MIL/uL   Hemoglobin 14.9 13.0 - 17.0 g/dL   HCT 43.3 39.0 - 52.0 %    MCV 93.1 78.0 - 100.0 fL   MCH 32.0 26.0 - 34.0 pg   MCHC 34.4 30.0 - 36.0 g/dL   RDW 11.9 11.5 - 15.5 %   Platelets 241 150 - 400 K/uL  Urinalysis, Routine w reflex microscopic     Status: Abnormal   Collection Time: 10/30/15 11:12 PM  Result Value Ref Range   Color, Urine YELLOW YELLOW   APPearance CLEAR CLEAR   Specific Gravity, Urine 1.025 1.005 - 1.030   pH 6.5 5.0 - 8.0   Glucose, UA NEGATIVE NEGATIVE mg/dL   Hgb urine dipstick NEGATIVE NEGATIVE   Bilirubin Urine NEGATIVE NEGATIVE   Ketones, ur 15 (A) NEGATIVE mg/dL   Protein, ur NEGATIVE NEGATIVE mg/dL   Nitrite NEGATIVE NEGATIVE   Leukocytes, UA NEGATIVE NEGATIVE    Comment: MICROSCOPIC NOT DONE ON URINES WITH NEGATIVE PROTEIN, BLOOD, LEUKOCYTES, NITRITE, OR GLUCOSE <1000 mg/dL.   Ct Abdomen Pelvis W Contrast  Result Date: 10/31/2015 CLINICAL DATA:  Acute onset of right lower quadrant abdominal pain, chills and fever. Nausea. Initial encounter. EXAM: CT  ABDOMEN AND PELVIS WITH CONTRAST TECHNIQUE: Multidetector CT imaging of the abdomen and pelvis was performed using the standard protocol following bolus administration of intravenous contrast. CONTRAST:  142m ISOVUE-300 IOPAMIDOL (ISOVUE-300) INJECTION 61% COMPARISON:  None. FINDINGS: The visualized lung bases are clear. The liver and spleen are unremarkable in appearance. The gallbladder is within normal limits. The pancreas and adrenal glands are unremarkable. The kidneys are unremarkable in appearance. There is no evidence of hydronephrosis. No renal or ureteral stones are seen. No perinephric stranding is appreciated. Trace fluid within the pelvis likely reflects the underlying appendiceal process. The small bowel is unremarkable in appearance. The stomach is within normal limits. No acute vascular abnormalities are seen. The appendix is dilated to 1.6 cm, with surrounding soft tissue inflammation and trace free fluid, concerning for acute appendicitis. An appendicolith is noted  at the base of the appendix, measuring 1.7 cm. There is no evidence of perforation or abscess formation at this time. The appendix is retrocecal in nature. The colon is partially filled with fluid, and is unremarkable in appearance. The bladder is largely decompressed and not well assessed. The prostate remains normal in size, with scattered calcification. No inguinal lymphadenopathy is seen. No acute osseous abnormalities are identified. Vacuum phenomenon is noted at L5-S1. IMPRESSION: Acute appendicitis, with dilatation of the appendix to 1.6 cm, surrounding soft inflammation and trace free fluid. Appendicolith noted at the base of the appendix, measuring 1.7 cm. No evidence of perforation or abscess formation at this time. The appendix is retrocecal in nature. These results were called by telephone at the time of interpretation on 10/31/2015 at 12:03 am to [facility], who verbally acknowledged these results. Electronically Signed   By: JGarald BaldingM.D.   On: 10/31/2015 00:05    Review of Systems  Constitutional: Negative for chills and fever.  Respiratory: Negative for shortness of breath.   Cardiovascular: Negative for chest pain.  Gastrointestinal: Positive for abdominal pain and nausea. Negative for vomiting.    Blood pressure 132/80, pulse 74, temperature 99.5 F (37.5 C), temperature source Oral, resp. rate 18, SpO2 100 %. Physical Exam  Vitals reviewed. Constitutional: He appears well-developed and well-nourished.  HENT:  Head: Normocephalic and atraumatic.  Neck: Neck supple.  Cardiovascular: Normal rate, regular rhythm and normal heart sounds.   Respiratory: Effort normal and breath sounds normal. He has no wheezes. He has no rales.  GI: Soft. There is tenderness in the right lower quadrant.     Assessment/Plan Appendicitis  Admission, iv abx, plan for lap appy in am  [facility] MD 10/31/2015, 12:33 AM

## 2015-11-01 ENCOUNTER — Encounter (HOSPITAL_COMMUNITY): Payer: Self-pay | Admitting: Surgery

## 2015-11-01 DIAGNOSIS — K3580 Unspecified acute appendicitis: Secondary | ICD-10-CM | POA: Diagnosis present

## 2015-11-01 MED ORDER — SODIUM CHLORIDE 0.9% FLUSH
3.0000 mL | Freq: Two times a day (BID) | INTRAVENOUS | Status: DC
  Administered 2015-11-01 – 2015-11-02 (×2): 3 mL via INTRAVENOUS

## 2015-11-01 MED ORDER — DEXTROSE 5 % IV SOLN
2.0000 g | INTRAVENOUS | Status: DC
  Administered 2015-11-01 – 2015-11-02 (×2): 2 g via INTRAVENOUS
  Filled 2015-11-01 (×2): qty 2

## 2015-11-01 MED ORDER — POLYETHYLENE GLYCOL 3350 17 G PO PACK
17.0000 g | PACK | Freq: Every day | ORAL | Status: DC
  Administered 2015-11-01: 17 g via ORAL
  Filled 2015-11-01 (×2): qty 1

## 2015-11-01 MED ORDER — SODIUM CHLORIDE 0.9 % IV SOLN: 250.0000 mL | INTRAVENOUS | Status: DC | PRN

## 2015-11-01 MED ORDER — SODIUM CHLORIDE 0.9% FLUSH: 3.0000 mL | INTRAVENOUS | Status: DC | PRN

## 2015-11-01 NOTE — Op Note (Signed)
NAMECHEE, KINSLOW NO.:  [id number]  MEDICAL RECORD NO.:  [id number]  LOCATION:  5N02C                        FACILITY:  [facility]  PHYSICIAN:  Marcello Moores A. Aurelio Mccamy, M.D.DATE OF BIRTH:  10-27-1975  DATE OF PROCEDURE:  10/31/2015 DATE OF DISCHARGE:                              OPERATIVE REPORT   PREOPERATIVE DIAGNOSIS:  Acute appendicitis.  POSTOPERATIVE DIAGNOSIS:  Perforated acute appendicitis.  PROCEDURE:  Laparoscopic appendectomy.  SURGEON:  Marcello Moores A. Merle Cirelli, M.D.  ANESTHESIA:  General endotracheal anesthesia with 0.25% Sensorcaine local with epinephrine.  EBL:  70 mL.  DRAINS:  None.  SPECIMEN:  Appendix to Pathology.  INDICATIONS FOR PROCEDURE:  The patient is a 40 year old male, who admitted earlier this morning with acute appendicitis.  He had a 1-day history of abdominal pain, which localized to his right lower quadrant. CT showed appendicitis without perforation.  We discussed options of medical and surgical management.  The pros and cons of these were discussed preoperatively.  Risks, benefits and alternative therapies were discussed at great length.  He wished to proceed after discussion with laparoscopic appendectomy.  DESCRIPTION OF PROCEDURE:  The patient was met in the holding area and questions were answered.  The patient was taken back to the operating room and placed supine on the OR table.  After induction of general anesthesia, the left arm was tucked, the right arm was placed on an armboard.  He was then prepped and draped in a sterile fashion.  We did not place a Foley catheter in the circumstance due to the patient's hypospadias noted.  Time-out was done.  He received preoperative antibiotics.  After sterile prep and drape, a curvilinear incision was made along the superior aspect of the umbilicus.  He had a small umbilical hernia noted there.  I dissected down and found the hernia sac and opened the hernia.  Some preperitoneal  fat was caught in this. There was about a 1-cm defect.  I placed a pursestring suture of 0 Vicryl around this and placed a 12-mm Hasson port through this. Pneumoperitoneum was created to 15 mmHg of pressure of CO2.  A 5-mm port was placed in the right upper quadrant and a second 5-mm port was placed in the left lower quadrant.  The appendix was identified.  The base was seen coming off the cecum.  With the patient rolled his left, I was able to mobilize the appendix from the pericolonic fat.  Of note, there was perforation of the appendix and the mid-appendix.  The appendix was separated into two separate pieces.  I found the base of the appendix and used the Harmonic scalpel to dissect out the mesoappendix.  A GIA 75 stapling device was placed across the base of the appendix.  This fired easily.  This fragment was placed in an EndoCatch bag and then removed. The rest of the appendix was dissected out of the pericolonic fat with care taken not to injure the colon.  Harmonic scalp was used to dissect the remainder of the appendix out.  This was placed in a second EndoCatch bag and removed and passed off the field.  The appendiceal stump was closed.  There was no signs of  bleeding or leakage from this. The mesoappendix was made hemostatic with irrigation and Harmonic scalpel.  The colon was examined and then I saw no evidence of injury to the colon.  Laparoscopy performed and excess fluid was suctioned out. There was no evidence of injury to the small or large bowel during the 4- quadrant laparoscopy.  No other significant abnormality was noted.  At this point in time, all the ports were then removed.  CO2 was allowed to escape.  The umbilical port site was closed with 0 Vicryl.  4-0 Monocryl was used to close the skin.  Liquid adhesive applied.  All final counts were found to be correct.  The patient awoke, extubated, and taken to recovery in satisfactory condition.     Quamere Mussell A.  Gedalia Mcmillon, M.D.     TAC/MEDQ  D:  10/31/2015  T:  11/01/2015  Job:  [id number]

## 2015-11-01 NOTE — Progress Notes (Signed)
1 Day Post-Op  Subjective: Pt sore   Objective: Vital signs in last 24 hours: Temp:  [98 F (36.7 C)-100.1 F (37.8 C)] 100.1 F (37.8 C) (08/15 0537) Pulse Rate:  [54-92] 92 (08/15 0537) Resp:  [12-15] 14 (08/15 0537) BP: (115-129)/(61-83) 115/61 (08/15 0537) SpO2:  [98 %-100 %] 98 % (08/15 0537) Last BM Date: 10/29/15  Intake/Output from previous day: 08/14 0701 - 08/15 0700 In: 2717.5 [P.O.:480; I.V.:2037.5; IV Piggyback:200] Out: -  Intake/Output this shift: No intake/output data recorded.  Incision/Wound:CDI sore RLQ ABDOMEN  Lab Results:   Recent Labs  10/30/15 2142  WBC 12.7*  HGB 14.9  HCT 43.3  PLT 241   BMET  Recent Labs  10/30/15 2142  NA 137  K 3.6  CL 101  CO2 27  GLUCOSE 123*  BUN 9  CREATININE 1.13  CALCIUM 9.9   PT/INR No results for input(s): LABPROT, INR in the last 72 hours. ABG No results for input(s): PHART, HCO3 in the last 72 hours.  Invalid input(s): PCO2, PO2  Studies/Results: Ct Abdomen Pelvis W Contrast  Result Date: 10/31/2015 CLINICAL DATA:  Acute onset of right lower quadrant abdominal pain, chills and fever. Nausea. Initial encounter. EXAM: CT ABDOMEN AND PELVIS WITH CONTRAST TECHNIQUE: Multidetector CT imaging of the abdomen and pelvis was performed using the standard protocol following bolus administration of intravenous contrast. CONTRAST:  157mL ISOVUE-300 IOPAMIDOL (ISOVUE-300) INJECTION 61% COMPARISON:  None. FINDINGS: The visualized lung bases are clear. The liver and spleen are unremarkable in appearance. The gallbladder is within normal limits. The pancreas and adrenal glands are unremarkable. The kidneys are unremarkable in appearance. There is no evidence of hydronephrosis. No renal or ureteral stones are seen. No perinephric stranding is appreciated. Trace fluid within the pelvis likely reflects the underlying appendiceal process. The small bowel is unremarkable in appearance. The stomach is within normal limits. No  acute vascular abnormalities are seen. The appendix is dilated to 1.6 cm, with surrounding soft tissue inflammation and trace free fluid, concerning for acute appendicitis. An appendicolith is noted at the base of the appendix, measuring 1.7 cm. There is no evidence of perforation or abscess formation at this time. The appendix is retrocecal in nature. The colon is partially filled with fluid, and is unremarkable in appearance. The bladder is largely decompressed and not well assessed. The prostate remains normal in size, with scattered calcification. No inguinal lymphadenopathy is seen. No acute osseous abnormalities are identified. Vacuum phenomenon is noted at L5-S1. IMPRESSION: Acute appendicitis, with dilatation of the appendix to 1.6 cm, surrounding soft inflammation and trace free fluid. Appendicolith noted at the base of the appendix, measuring 1.7 cm. No evidence of perforation or abscess formation at this time. The appendix is retrocecal in nature. These results were called by telephone at the time of interpretation on 10/31/2015 at 12:03 am to [facility], who verbally acknowledged these results. Electronically Signed   By: Garald Balding M.D.   On: 10/31/2015 00:05    Anti-infectives: Anti-infectives    Start     Dose/Rate Route Frequency Ordered Stop   11/01/15 0900  cefTRIAXone (ROCEPHIN) 2 g in dextrose 5 % 50 mL IVPB     2 g 100 mL/hr over 30 Minutes Intravenous Every 24 hours 11/01/15 0757     10/31/15 0800  metroNIDAZOLE (FLAGYL) IVPB 500 mg     500 mg 100 mL/hr over 60 Minutes Intravenous Every 8 hours 10/31/15 0325     10/31/15 0015  cefTRIAXone (ROCEPHIN)  2 g in dextrose 5 % 50 mL IVPB     2 g 100 mL/hr over 30 Minutes Intravenous  Once 10/31/15 0010 10/31/15 0044   10/31/15 0015  metroNIDAZOLE (FLAGYL) IVPB 500 mg     500 mg 100 mL/hr over 60 Minutes Intravenous  Once 10/31/15 0010 10/31/15 0143      Assessment/Plan: s/p Procedure(s): APPENDECTOMY LAPAROSCOPIC  (N/A) Will need total of 10 days of ABX  Advance diet Low grade fever  Ambulate  Home once fever normal for 24 hours   LOS: 1 day    Sharni Negron A. 11/01/2015

## 2015-11-02 ENCOUNTER — Encounter: Payer: Self-pay | Admitting: General Surgery

## 2015-11-02 LAB — CBC
HEMATOCRIT: 36.8 % — AB (ref 39.0–52.0)
Hemoglobin: 12.2 g/dL — ABNORMAL LOW (ref 13.0–17.0)
MCH: 31.1 pg (ref 26.0–34.0)
MCHC: 33.2 g/dL (ref 30.0–36.0)
MCV: 93.9 fL (ref 78.0–100.0)
PLATELETS: 201 10*3/uL (ref 150–400)
RBC: 3.92 MIL/uL — AB (ref 4.22–5.81)
RDW: 11.9 % (ref 11.5–15.5)
WBC: 8.3 10*3/uL (ref 4.0–10.5)

## 2015-11-02 MED ORDER — OXYCODONE-ACETAMINOPHEN 5-325 MG PO TABS: 1.0000 | ORAL_TABLET | ORAL | 0 refills | Status: AC | PRN

## 2015-11-02 MED ORDER — POLYETHYLENE GLYCOL 3350 17 G PO PACK: 17.0000 g | PACK | Freq: Every day | ORAL | 0 refills | Status: AC | PRN

## 2015-11-02 MED ORDER — METRONIDAZOLE 500 MG PO TABS: 500.0000 mg | ORAL_TABLET | Freq: Three times a day (TID) | ORAL | 0 refills | Status: AC

## 2015-11-02 MED ORDER — CIPROFLOXACIN HCL 500 MG PO TABS: 500.0000 mg | ORAL_TABLET | Freq: Two times a day (BID) | ORAL | 0 refills | Status: AC

## 2015-11-02 MED ORDER — CIPROFLOXACIN HCL 500 MG PO TABS
500.0000 mg | ORAL_TABLET | Freq: Two times a day (BID) | ORAL | Status: DC
  Administered 2015-11-02: 500 mg via ORAL
  Filled 2015-11-02: qty 1

## 2015-11-02 NOTE — Discharge Summary (Signed)
[facility] Discharge Summary   Patient ID: Brendan Fowler MRN: [id number] DOB/AGE: [birth year] 40 y.o.  Admit date: 10/30/2015 Discharge date: 11/02/2015  Admitting Diagnosis: Acute appendcitis  Discharge Diagnosis Patient Active Problem List   Diagnosis Date Noted  . Acute appendicitis 11/01/2015  . Appendicitis 10/31/2015    Consultants None   Imaging: No results found.  Procedures Dr. Marcello Moores Cornett (10/31/15) - Laparoscopic Appendectomy  Hospital Course:  40 y/o male who presented to [facility] with 2 days of RLQ pain.  Workup significant for appendicitis.  Patient was admitted and underwent procedure listed above, where his appendix was found to be perforated.  Tolerated procedure well and was transferred to the floor. He did develop a fever after surgery. Diet was advanced as tolerated.  On POD#2 the patient was afebrile for 24 hours, voiding well, tolerating diet, ambulating well, pain well controlled, vital signs stable, incisions c/d/i and felt stable for discharge home.  Patient will continue PO abx for 1 week and will follow up in our office in 2 weeks. He knows to call with questions or concerns.   Physical Exam: General:  Alert, NAD, pleasant, comfortable Abd:  Soft, ND, mild tenderness, incisions C/D/I  Follow-up Information    [facility] Schedule an appointment as soon as possible for a visit in 2 week(s).   Specialty:  General Surgery Why:  for post operative follow up from your laparoscopic cholecystectomy  Contact information: [address] [place] [phone]         .meds Signed: Obie Dredge, [facility] 11/02/2015, 10:31 AM Pager: [phone] Consults: [phone] Mon-Fri 7:00 am-4:30 pm Sat-Sun 7:00 am-11:30 am

## 2015-11-02 NOTE — Discharge Instructions (Signed)
Please schedule an appointment for post-operative follow up in 2 weeks. please arrive at least 30 min before your appointment to complete your check in paperwork.  If you are unable to arrive 30 min prior to your appointment time we may have to cancel or reschedule you.  LAPAROSCOPIC SURGERY: POST OP INSTRUCTIONS  1. DIET: Follow a light bland diet the first 24 hours after arrival home, such as soup, liquids, crackers, etc. Be sure to include lots of fluids daily. Avoid fast food or heavy meals as your are more likely to get nauseated. Eat a low fat the next few days after surgery.  2. Take your usually prescribed home medications unless otherwise directed. 3. PAIN CONTROL:  1. Pain is best controlled by a usual combination of three different methods TOGETHER:  1. Ice/Heat 2. Over the counter pain medication 3. Prescription pain medication 2. Most patients will experience some swelling and bruising around the incisions. Ice packs or heating pads (30-60 minutes up to 6 times a day) will help. Use ice for the first few days to help decrease swelling and bruising, then switch to heat to help relax tight/sore spots and speed recovery. Some people prefer to use ice alone, heat alone, alternating between ice & heat. Experiment to what works for you. Swelling and bruising can take several weeks to resolve.  3. It is helpful to take an over-the-counter pain medication regularly for the first few weeks. Choose one of the following that works best for you:  1. Naproxen (Aleve, etc) Two 220mg  tabs twice a day 2. Ibuprofen (Advil, etc) Three 200mg  tabs four times a day (every meal & bedtime) 3. Acetaminophen (Tylenol, etc) 500-650mg  four times a day (every meal & bedtime) 4. A prescription for pain medication (such as oxycodone, hydrocodone, etc) should be given to you upon discharge. Take your pain medication as prescribed.  1. If you are having problems/concerns with the prescription medicine (does not control  pain, nausea, vomiting, rash, itching, etc), please call us [phone] to see if we need to switch you to a different pain medicine that will work better for you and/or control your side effect better. 2. If you need a refill on your pain medication, please contact your pharmacy. They will contact our office to request authorization. Prescriptions will not be filled after 5 pm or on week-ends. 4. Avoid getting constipated. Between the surgery and the pain medications, it is common to experience some constipation. Increasing fluid intake and taking a fiber supplement (such as Metamucil, Citrucel, FiberCon, MiraLax, etc) 1-2 times a day regularly will usually help prevent this problem from occurring. A mild laxative (prune juice, Milk of Magnesia, MiraLax, etc) should be taken according to package directions if there are no bowel movements after 48 hours.  5. Watch out for diarrhea. If you have many loose bowel movements, simplify your diet to bland foods & liquids for a few days. Stop any stool softeners and decrease your fiber supplement. Switching to mild anti-diarrheal medications (Kayopectate, Pepto Bismol) can help. If this worsens or does not improve, please call us. 6. Wash / shower every day. You may shower over the dressings as they are waterproof. Continue to shower over incision(s) after the dressing is off. 7. Remove your waterproof bandages 5 days after surgery. You may leave the incision open to air. You may replace a dressing/Band-Aid to cover the incision for comfort if you wish.  8. ACTIVITIES as tolerated:  1. You may resume regular (light) daily  activities beginning the next day--such as daily self-care, walking, climbing stairs--gradually increasing activities as tolerated. If you can walk 30 minutes without difficulty, it is safe to try more intense activity such as jogging, treadmill, bicycling, low-impact aerobics, swimming, etc. 2. Save the most intensive and strenuous activity for  last such as sit-ups, heavy lifting, contact sports, etc Refrain from any heavy lifting or straining until you are off narcotics for pain control.  3. DO NOT PUSH THROUGH PAIN. Let pain be your guide: If it hurts to do something, don't do it. Pain is your body warning you to avoid that activity for another week until the pain goes down. 4. You may drive when you are no longer taking prescription pain medication, you can comfortably wear a seatbelt, and you can safely maneuver your car and apply brakes. 5. You may have sexual intercourse when it is comfortable.  9. FOLLOW UP in our office  1. Please call [facility] at [phone] to set up an appointment to see your surgeon in the office for a follow-up appointment approximately 2-3 weeks after your surgery. 2. Make sure that you call for this appointment the day you arrive home to insure a convenient appointment time.      10. IF YOU HAVE DISABILITY OR FAMILY LEAVE FORMS, BRING THEM TO THE               OFFICE FOR PROCESSING.   WHEN TO CALL us [phone]:  1. Poor pain control 2. Reactions / problems with new medications (rash/itching, nausea, etc)  3. Fever over 101.5 F (38.5 C) 4. Inability to urinate 5. Nausea and/or vomiting 6. Worsening swelling or bruising 7. Continued bleeding from incision. 8. Increased pain, redness, or drainage from the incision  The clinic staff is available to answer your questions during regular business hours (8:30am-5pm). Please dont hesitate to call and ask to speak to one of our nurses for clinical concerns.  If you have a medical emergency, go to the nearest emergency room or call 911.  A surgeon from [facility] is always on call at the [facility], [place], [place], [place], [place] [address] ?  MAIN: [phone] ? TOLL FREE: [phone] ?  FAX [phone]  [email or web address]

## 2015-11-02 NOTE — Progress Notes (Signed)
Reviewed discharge information/medications with patient.  Answered all of his questions. Patient is waiting on ride.

## 2016-06-11 ENCOUNTER — Encounter (HOSPITAL_COMMUNITY): Payer: Self-pay

## 2016-06-11 ENCOUNTER — Emergency Department (HOSPITAL_COMMUNITY)
Admission: EM | Admit: 2016-06-11 | Discharge: 2016-06-11 | Disposition: A | Discharge status: Home / Self Care | Payer: BC Managed Care – PPO | Attending: Emergency Medicine | Admitting: Emergency Medicine

## 2016-06-11 ENCOUNTER — Emergency Department (HOSPITAL_COMMUNITY): Payer: BC Managed Care – PPO

## 2016-06-11 DIAGNOSIS — S53104A Unspecified dislocation of right ulnohumeral joint, initial encounter: Secondary | ICD-10-CM | POA: Insufficient documentation

## 2016-06-11 DIAGNOSIS — Y9289 Other specified places as the place of occurrence of the external cause: Secondary | ICD-10-CM | POA: Insufficient documentation

## 2016-06-11 DIAGNOSIS — W1839XA Other fall on same level, initial encounter: Secondary | ICD-10-CM | POA: Insufficient documentation

## 2016-06-11 DIAGNOSIS — Y9359 Activity, other involving other sports and athletics played individually: Secondary | ICD-10-CM | POA: Diagnosis not present

## 2016-06-11 DIAGNOSIS — F909 Attention-deficit hyperactivity disorder, unspecified type: Secondary | ICD-10-CM | POA: Insufficient documentation

## 2016-06-11 DIAGNOSIS — W19XXXA Unspecified fall, initial encounter: Secondary | ICD-10-CM

## 2016-06-11 DIAGNOSIS — Y999 Unspecified external cause status: Secondary | ICD-10-CM | POA: Diagnosis not present

## 2016-06-11 DIAGNOSIS — S59901A Unspecified injury of right elbow, initial encounter: Secondary | ICD-10-CM | POA: Diagnosis present

## 2016-06-11 DIAGNOSIS — S53106A Unspecified dislocation of unspecified ulnohumeral joint, initial encounter: Secondary | ICD-10-CM

## 2016-06-11 MED ORDER — SODIUM CHLORIDE 0.9 % IV BOLUS (SEPSIS): 1000.0000 mL | Freq: Once | INTRAVENOUS | Status: DC

## 2016-06-11 MED ORDER — OXYCODONE-ACETAMINOPHEN 5-325 MG PO TABS
1.0000 | ORAL_TABLET | ORAL | Status: DC | PRN
Start: 2016-06-11 — End: 2016-06-12
  Administered 2016-06-11: 1 via ORAL

## 2016-06-11 MED ORDER — BUPIVACAINE HCL (PF) 0.5 % IJ SOLN
5.0000 mL | Freq: Once | INTRAMUSCULAR | Status: AC
  Administered 2016-06-11: 5 mL
  Filled 2016-06-11: qty 10

## 2016-06-11 MED ORDER — HYDROCODONE-ACETAMINOPHEN 5-325 MG PO TABS: 1.0000 | ORAL_TABLET | ORAL | 0 refills | Status: AC | PRN

## 2016-06-11 MED ORDER — OXYCODONE-ACETAMINOPHEN 5-325 MG PO TABS
ORAL_TABLET | ORAL | Status: DC
Start: 2016-06-11 — End: 2016-06-12
  Filled 2016-06-11: qty 1

## 2016-06-11 MED ORDER — FENTANYL CITRATE (PF) 100 MCG/2ML IJ SOLN
50.0000 ug | Freq: Once | INTRAMUSCULAR | Status: AC
  Administered 2016-06-11: 50 ug via INTRAVENOUS
  Filled 2016-06-11: qty 2

## 2016-06-11 NOTE — ED Notes (Signed)
ED Provider at bedside. 

## 2016-06-11 NOTE — Consult Note (Signed)
ORTHOPAEDIC CONSULTATION  REQUESTING PHYSICIAN: Daleen Bo, MD  Chief Complaint: right elbow injury  HPI: Brendan Fowler is a 41 y.o. male who complains of a right elbow injury sliding into second base playing kickball  Past Medical History:  Diagnosis Date  . ADHD (attention deficit hyperactivity disorder)    Past Surgical History:  Procedure Laterality Date  . APPENDECTOMY  10/31/2015   laproscopic  . LAPAROSCOPIC APPENDECTOMY N/A 10/31/2015   Procedure: APPENDECTOMY LAPAROSCOPIC;  Surgeon: Erroll Luna, MD;  Location: [facility];  Service: General;  Laterality: N/A;   Social History   Social History  . Marital status: Married    Spouse name: N/A  . Number of children: N/A  . Years of education: N/A   Social History Main Topics  . Smoking status: Never Smoker  . Smokeless tobacco: Never Used  . Alcohol use Yes     Comment: occasional  . Drug use: No  . Sexual activity: Not Asked   Other Topics Concern  . None   Social History Narrative  . None   No family history on file. No Known Allergies Prior to Admission medications   Medication Sig Start Date End Date Taking? Authorizing Provider  amphetamine-dextroamphetamine (ADDERALL XR) 20 MG 24 hr capsule Take 20 mg by mouth daily. 10/11/15   Historical Provider, MD  ciprofloxacin (CIPRO) 500 MG tablet Take 1 tablet (500 mg total) by mouth 2 (two) times daily. 11/02/15   Jill Alexanders, PA-C  oxyCODONE-acetaminophen (PERCOCET/ROXICET) 5-325 MG tablet Take 1-2 tablets by mouth every 4 (four) hours as needed for moderate pain. 11/02/15   Darci Current Simaan, PA-C  polyethylene glycol (MIRALAX / GLYCOLAX) packet Take 17 g by mouth daily as needed. 11/02/15   Jill Alexanders, PA-C   Dg Elbow Complete Right  Result Date: 06/11/2016 CLINICAL DATA:  Status post fall, with right elbow deformity. Initial encounter. EXAM: RIGHT ELBOW - COMPLETE 3+ VIEW COMPARISON:  None. FINDINGS: There is dorsal dislocation of the  radius and ulna, with mild shortening at the site of dislocation, and apparent medial displacement. No definite elbow joint effusion is characterized. Surrounding soft tissue swelling is noted. IMPRESSION: Dorsal dislocation of the radius and ulna, with mild shortening at the site of dislocation, and apparent medial displacement. Electronically Signed   By: Garald Balding M.D.   On: 06/11/2016 22:53   Dg Forearm Right  Result Date: 06/11/2016 CLINICAL DATA:  Status post fall, with right elbow deformity. Initial encounter. EXAM: RIGHT FOREARM - 2 VIEW COMPARISON:  None. FINDINGS: There is dorsal dislocation of the radius and ulna with respect to the distal humerus. Mild shortening and rotation are noted. Surrounding soft tissue swelling is noted. No definite elbow joint effusion is seen. The carpal rows appear grossly intact, and demonstrate normal alignment. IMPRESSION: Dorsal dislocation of the radius and ulna with respect to the distal humerus, with mild shortening and rotation. These results were called by telephone at the time of interpretation on 06/11/2016 at 10:51 pm to Dr. Daleen Bo, who verbally acknowledged these results. Electronically Signed   By: Garald Balding M.D.   On: 06/11/2016 22:52    Positive ROS: All other systems have been reviewed and were otherwise negative with the exception of those mentioned in the HPI and as above.  Labs cbc No results for input(s): WBC, HGB, HCT, PLT in the last 72 hours.  Labs inflam No results for input(s): CRP in the last 72 hours.  Invalid input(s): ESR  Labs coag No results for input(s): INR, PTT in the last 72 hours.  Invalid input(s): PT  No results for input(s): NA, K, CL, CO2, GLUCOSE, BUN, CREATININE, CALCIUM in the last 72 hours.  Physical Exam: Vitals:   06/11/16 2124 06/11/16 2141  BP:  97/64  Resp: 18   Temp: 97.7 F (36.5 C)    General: Alert, no acute distress Cardiovascular: No pedal edema Respiratory: No cyanosis,  no use of accessory musculature GI: No organomegaly, abdomen is soft and non-tender Skin: No lesions in the area of chief complaint other than those listed below in MSK exam.  Neurologic: Sensation intact distally save for the below mentioned MSK exam Psychiatric: Patient is competent for consent with normal mood and affect Lymphatic: No axillary or cervical lymphadenopathy  MUSCULOSKELETAL:  RUE: NVI, compartments soft. Obvious post/medial displacement Other extremities are atraumatic with painless ROM and NVI.  Assessment: R elbow dislocation  Plan: Closed elbow reduction performed by me in ED. Sling until f/u  Procedure:  After an appropriate Time out I injected 5cc of marcain .5% plain into his elbow joint. I then performed a closed reduction of his elbow    Renette Butters, MD Cell [phone]   06/11/2016 11:00 PM

## 2016-06-11 NOTE — ED Triage Notes (Signed)
Fall injury to right elbow with obvious deformity. Strong radial pulses bilaterally. Right hand cool to touch with decreased sensation

## 2016-06-11 NOTE — ED Notes (Signed)
For reduction assistance, please call Dr. Percell Miller at [phone]

## 2016-06-11 NOTE — ED Notes (Signed)
Care handoff to [place], [place]

## 2016-06-11 NOTE — ED Provider Notes (Signed)
[facility] DEPT Provider Note   CSN: [id number] Arrival date & time: 06/11/16  2111     History   Chief Complaint Chief Complaint  Patient presents with  . Arm Injury    HPI Brendan Fowler.  Patient presents for evaluation of right elbow pain after injury, sliding into base in a kickball game.  History of left elbow dislocation.  He has mild right shoulder pain at this time.  He denies injury to head neck or back.  Last ate and drank at 6:30 PM tonight.  No recent illnesses.  There are no other known modifying factors.  HPI  Past Medical History:  Diagnosis Date  . ADHD (attention deficit hyperactivity disorder)     Patient Active Problem List   Diagnosis Date Noted  . Acute appendicitis 11/01/2015  . Appendicitis 10/31/2015    Past Surgical History:  Procedure Laterality Date  . APPENDECTOMY  10/31/2015   laproscopic  . LAPAROSCOPIC APPENDECTOMY N/A 10/31/2015   Procedure: APPENDECTOMY LAPAROSCOPIC;  Surgeon: Erroll Luna, MD;  Location: [facility];  Service: General;  Laterality: N/A;       Home Medications    Prior to Admission medications   Medication Sig Start Date End Date Taking? Authorizing Provider  amphetamine-dextroamphetamine (ADDERALL XR) 20 MG 24 hr capsule Take 20 mg by mouth daily. 10/11/15   Historical Provider, MD  ciprofloxacin (CIPRO) 500 MG tablet Take 1 tablet (500 mg total) by mouth 2 (two) times daily. 11/02/15   Jill Alexanders, PA-C  oxyCODONE-acetaminophen (PERCOCET/ROXICET) 5-325 MG tablet Take 1-2 tablets by mouth every 4 (four) hours as needed for moderate pain. 11/02/15   Darci Current Simaan, PA-C  polyethylene glycol (MIRALAX / GLYCOLAX) packet Take 17 g by mouth daily as needed. 11/02/15   Jill Alexanders, PA-C    Family History No family history on file.  Social History Social History  Substance Use Topics  . Smoking status: Never Smoker  . Smokeless tobacco: Never Used  . Alcohol use Yes     Comment:  occasional     Allergies   Patient has no known allergies.   Review of Systems Review of Systems  All other systems reviewed and are negative.    Physical Exam Updated Vital Signs BP 97/64   Temp 97.7 F (36.5 C) (Oral)   Resp 18   Ht 6' (1.829 m)   Wt 180 lb (81.6 kg)   SpO2 97%   BMI 24.41 kg/m   Physical Exam  Constitutional: He is oriented to person, place, and time. He appears well-developed and well-nourished. He appears distressed (He is uncomfortable).  HENT:  Head: Normocephalic and atraumatic.  Right Ear: External ear normal.  Left Ear: External ear normal.  Eyes: Conjunctivae and EOM are normal. Pupils are equal, round, and reactive to light.  Neck: Normal range of motion and phonation normal. Neck supple.  Cardiovascular: Normal rate.   Pulmonary/Chest: Effort normal. He exhibits no bony tenderness.  Musculoskeletal:  Deformity right elbow with swelling, and dimpling posteriorly consistent with joint dislocation.  Neurovascularly intact distally in the right hand.  Decreased range of motion right wrist secondary to pain.  No deformity in the right wrist or right hand.  Neurological: He is alert and oriented to person, place, and time. No cranial nerve deficit or sensory deficit. He exhibits normal muscle tone. Coordination normal.  Skin: Skin is warm, dry and intact.  Psychiatric: He has a normal mood and affect. His behavior is  normal. Judgment and thought content normal.  Nursing note and vitals reviewed.    ED Treatments / Results  Labs (all labs ordered are listed, but only abnormal results are displayed) Labs Reviewed - No data to display  EKG  EKG Interpretation None       Radiology No results found.  Procedures Procedures (including critical care time)  Medications Ordered in ED Medications  oxyCODONE-acetaminophen (PERCOCET/ROXICET) 5-325 MG per tablet 1 tablet (1 tablet Oral Given 06/11/16 2142)  oxyCODONE-acetaminophen  (PERCOCET/ROXICET) 5-325 MG per tablet (not administered)     Initial Impression / Assessment and Plan / ED Course  I have reviewed the triage vital signs and the nursing notes.  Pertinent labs & imaging results that were available during my care of the patient were reviewed by me and considered in my medical decision making (see chart for details).  Clinical Course as of Jun 12 2335  Mon Jun 11, 2016  2244 Dr. Percell Miller is here and plans on reducing the subluxed right elbow joint.  He is going to do it with local anesthesia, and IV analgesia.  He does not anticipate using procedural sedation.  [EW]    Clinical Course User Index [EW] Daleen Bo, MD    Medications  oxyCODONE-acetaminophen (PERCOCET/ROXICET) 5-325 MG per tablet 1 tablet (1 tablet Oral Given 06/11/16 2142)  oxyCODONE-acetaminophen (PERCOCET/ROXICET) 5-325 MG per tablet (not administered)    Patient Vitals for the past 24 hrs:  BP Temp Temp src Resp SpO2 Height Weight  06/11/16 2141 97/64 - - - - - -  06/11/16 2130 - - - - - 6' (1.829 m) 180 lb (81.6 kg)  06/11/16 2124 - 97.7 F (36.5 C) Oral 18 97 % - -    11:17 PM Reevaluation with update and discussion. After initial assessment and treatment, an updated evaluation reveals he is comfortable now in splint after reduction procedure by Dr. Percell Miller.  Findings discussed with patient and wife, all questions answered. Brendan Fowler    Final Clinical Impressions(s) / ED Diagnoses   Final diagnoses:  Fall  Dislocation of right elbow, initial encounter    Dislocation right elbow without fracture.  Dislocation reduced by orthopedics in the emergency department.  They plan on following up with him in the outpatient setting for initiation of further treatment and rehabilitation if needed.    Nursing Notes Reviewed/ Care Coordinated Applicable Imaging Reviewed Interpretation of Laboratory Data incorporated into ED treatment  The patient appears reasonably screened  and/or stabilized for discharge and I doubt any other medical condition or other [facility] requiring further screening, evaluation, or treatment in the ED at this time prior to discharge.  Plan: Home Medications-continue usual medications; Home Treatments-arm sling when up, cryotherapy; return here if the recommended treatment, does not improve the symptoms; Recommended follow up-orthopedics follow-up as planned.    New Prescriptions New Prescriptions   No medications on file     Daleen Bo, MD 06/11/16 2338

## 2016-06-11 NOTE — Discharge Instructions (Signed)
Apply ice 3 or 4 times a day for 40 minutes.  If the sling on when you are up and moving.  Be careful when changing clothes, or showering, to avoid injuring your right elbow.

## 2017-11-12 IMAGING — DX DG ELBOW COMPLETE 3+V*R*
4 series · 4 of 4 positions shown · non-contrast
Comparison: None.

CLINICAL DATA: Status post fall, with right elbow deformity.
Initial encounter.

EXAM:
RIGHT ELBOW - COMPLETE 3+ VIEW

[elbow ap]
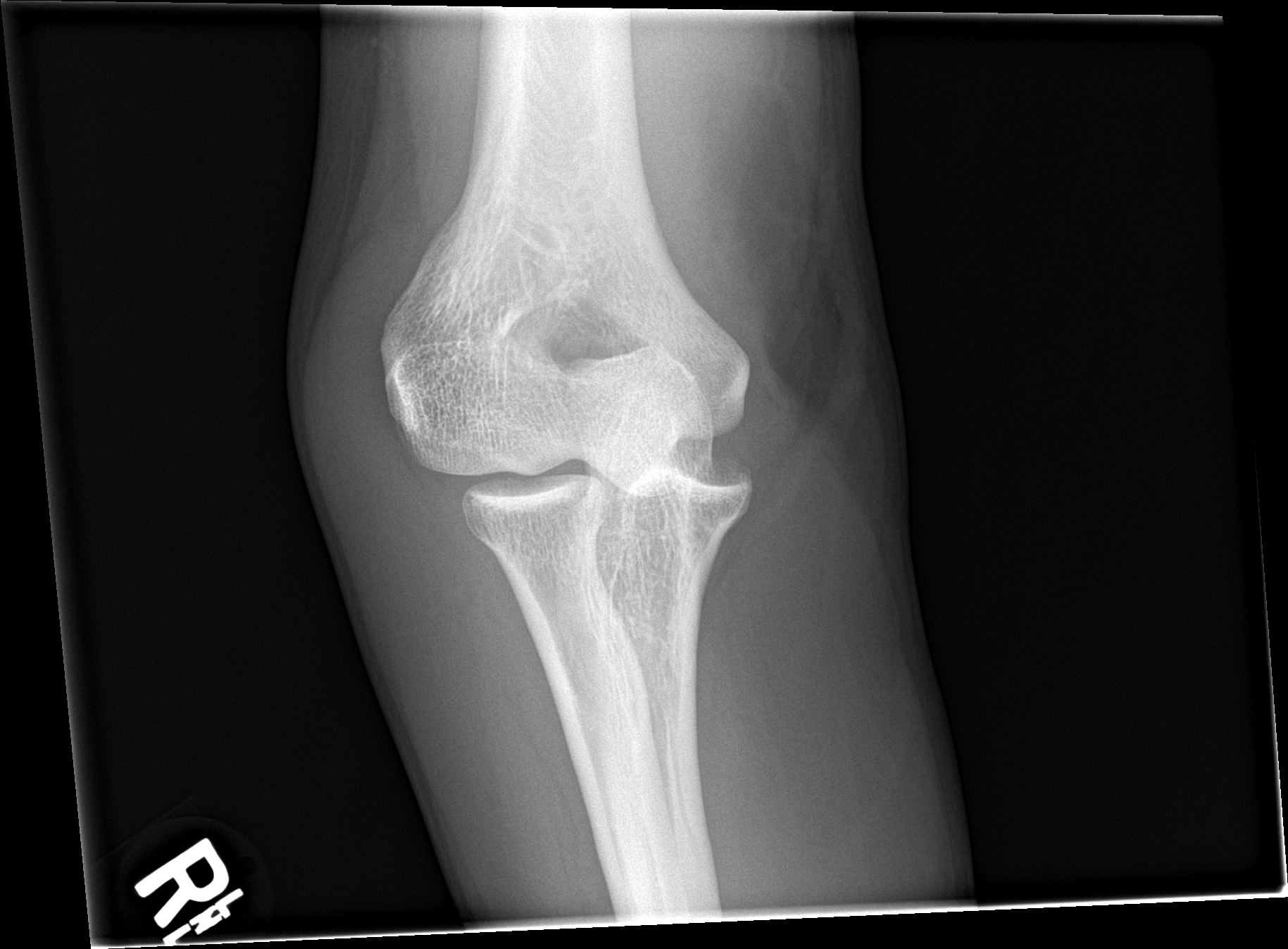

[elbow obl (1 of 2)]
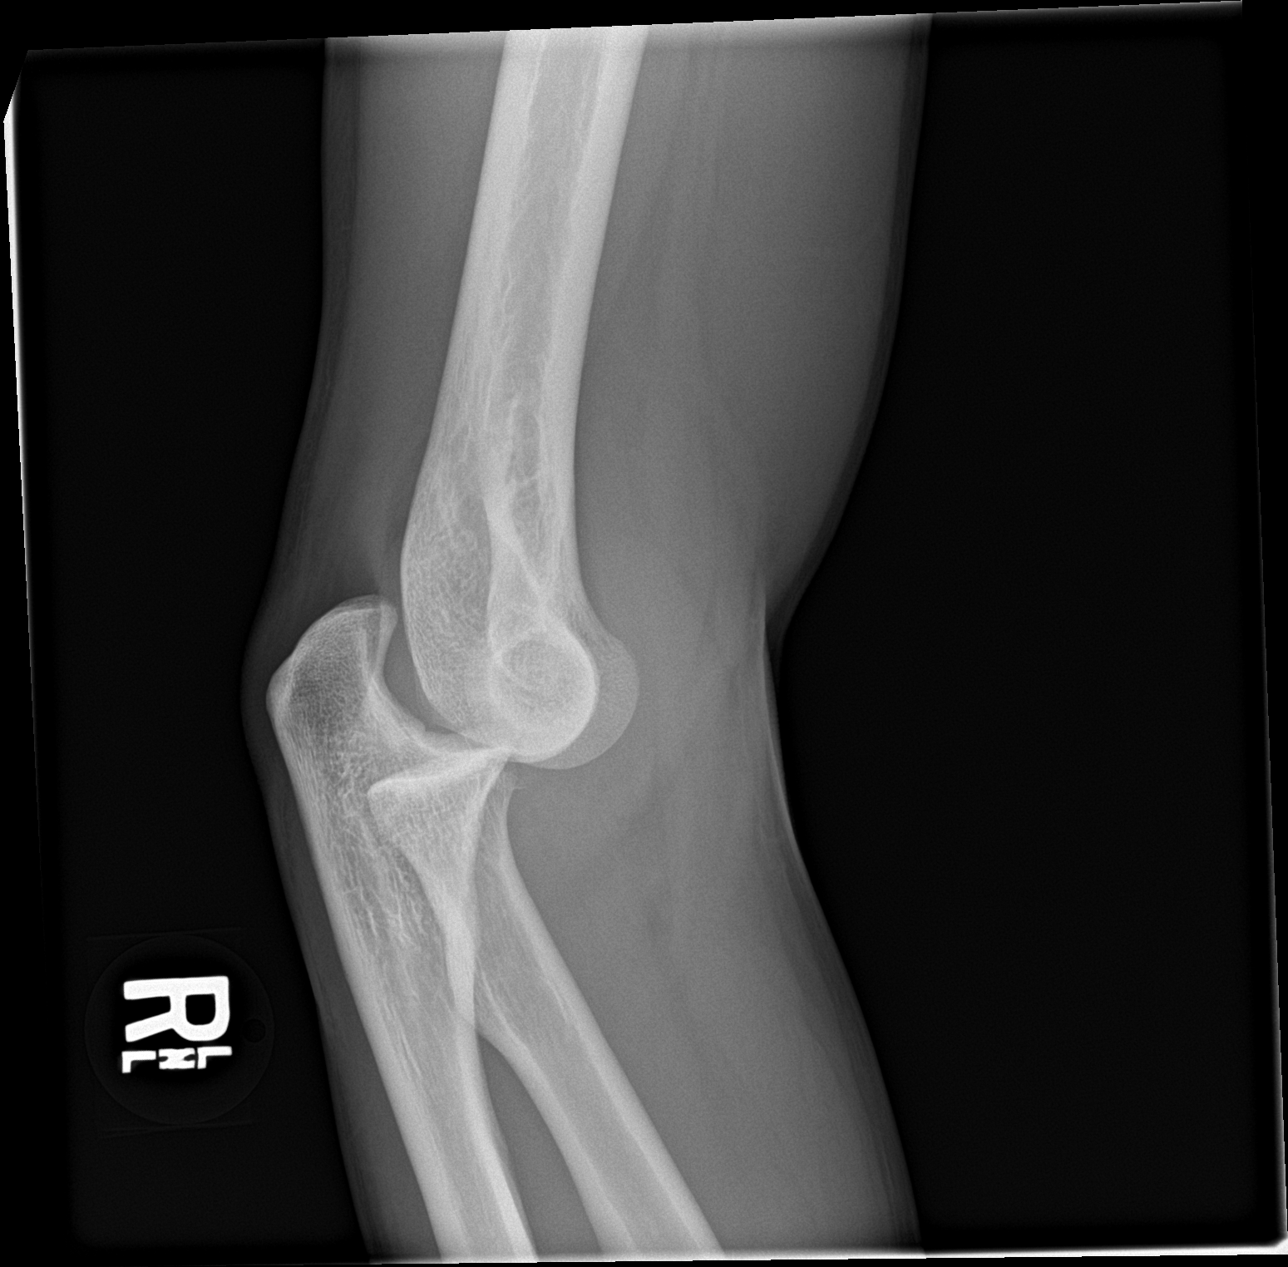

[elbow obl (2 of 2)]
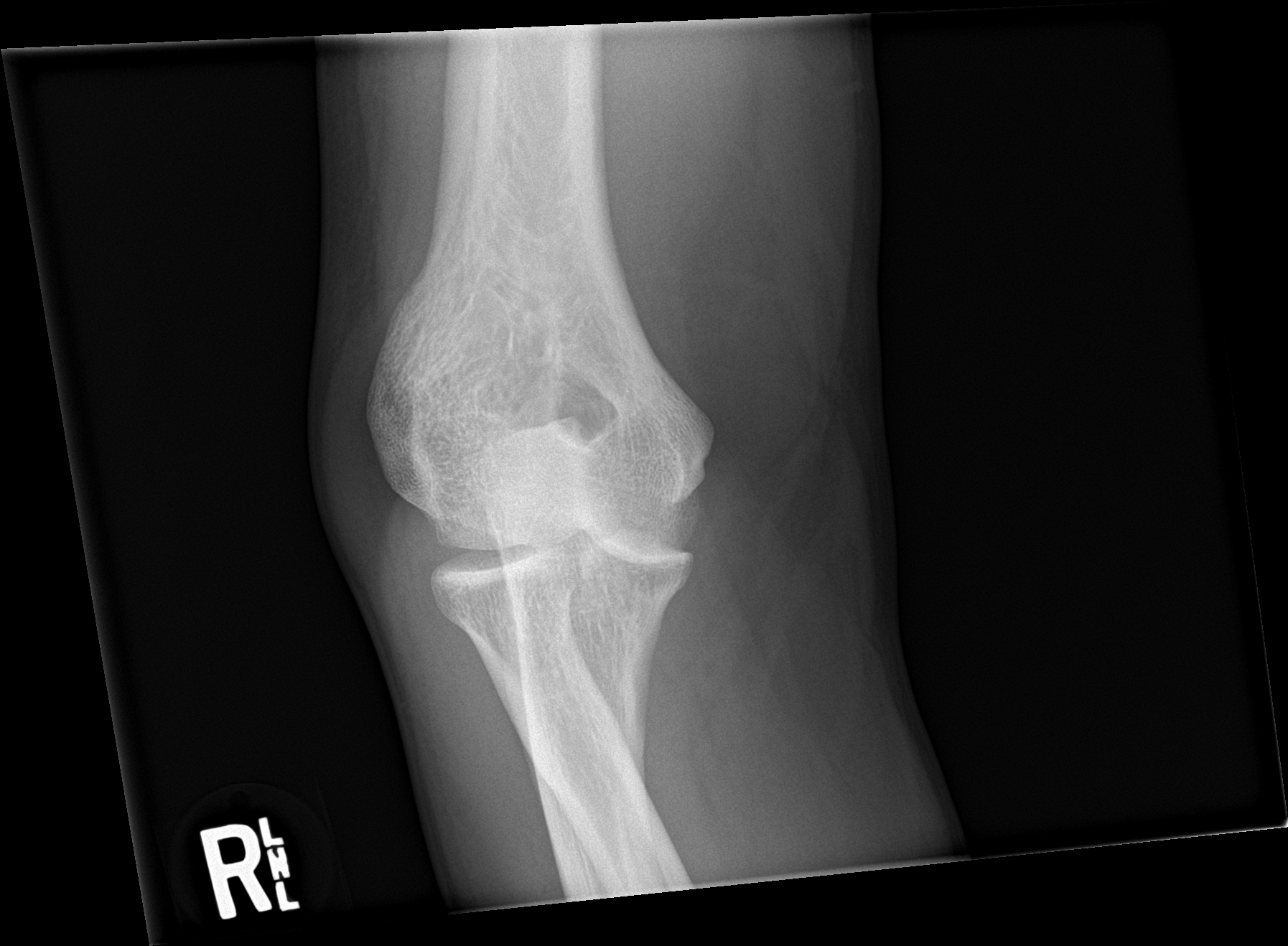

[elbow lat]
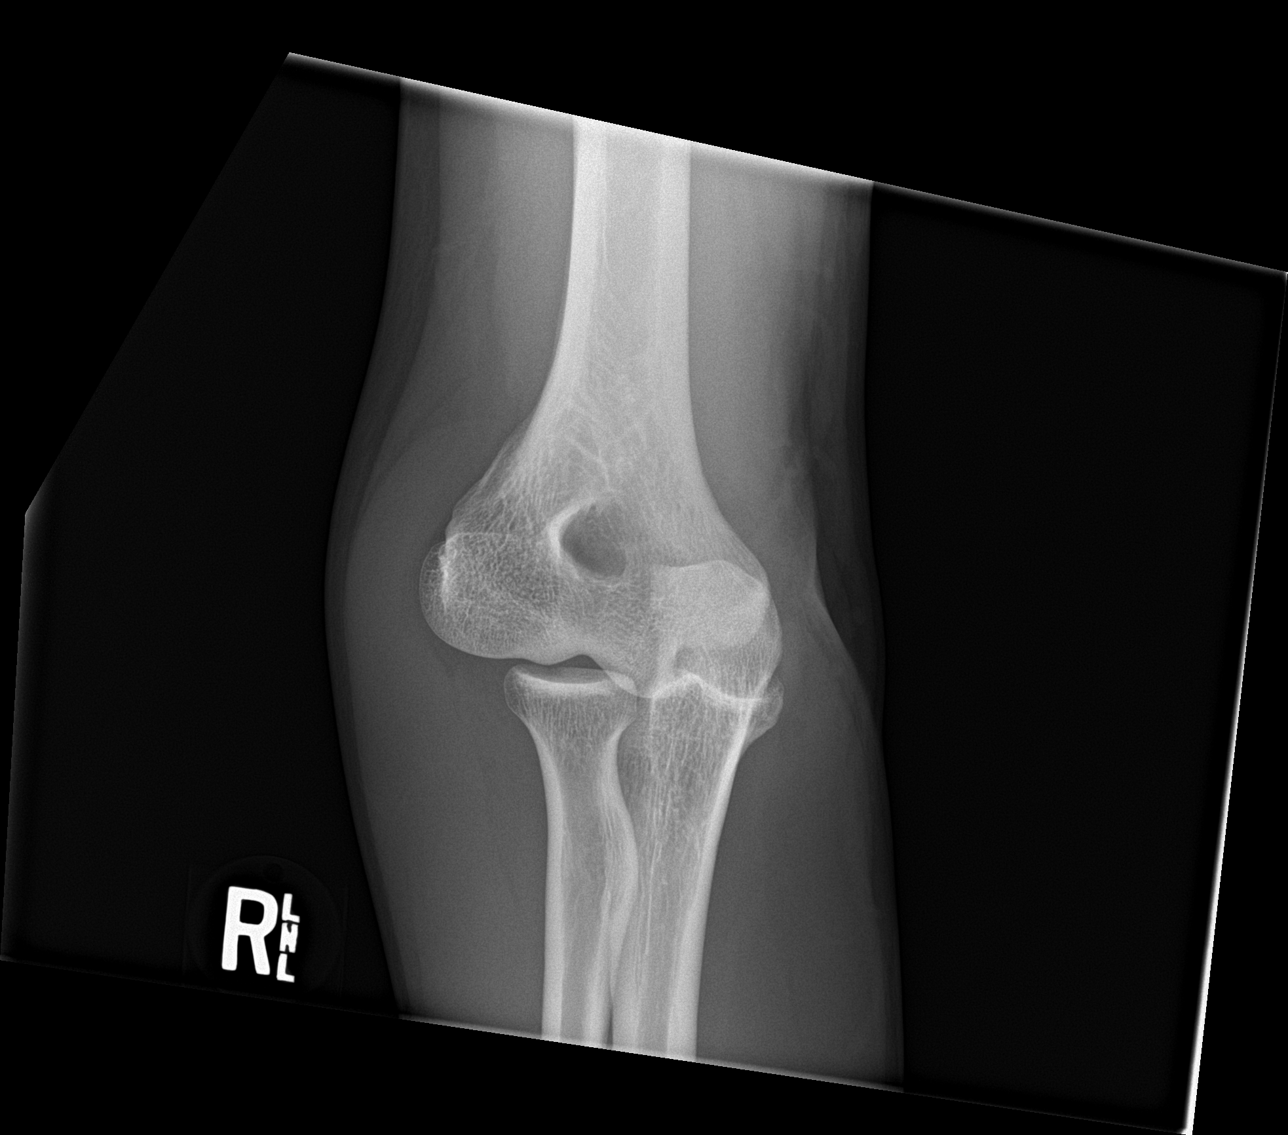

[4 of 4 positions shown; findings below may reference images not displayed]

FINDINGS: There is dorsal dislocation of the radius and ulna, with mild
shortening at the site of dislocation, and apparent medial
displacement. No definite elbow joint effusion is characterized.
Surrounding soft tissue swelling is noted.
IMPRESSION: Dorsal dislocation of the radius and ulna, with mild shortening at
the site of dislocation, and apparent medial displacement.

## 2017-11-12 IMAGING — CR DG ELBOW 2V*R*
2 series · 2 of 2 positions shown · non-contrast
Comparison: None.

CLINICAL DATA: Status post reduction of right elbow dislocation.
Initial encounter.

EXAM:
RIGHT ELBOW - 2 VIEW

[AP]
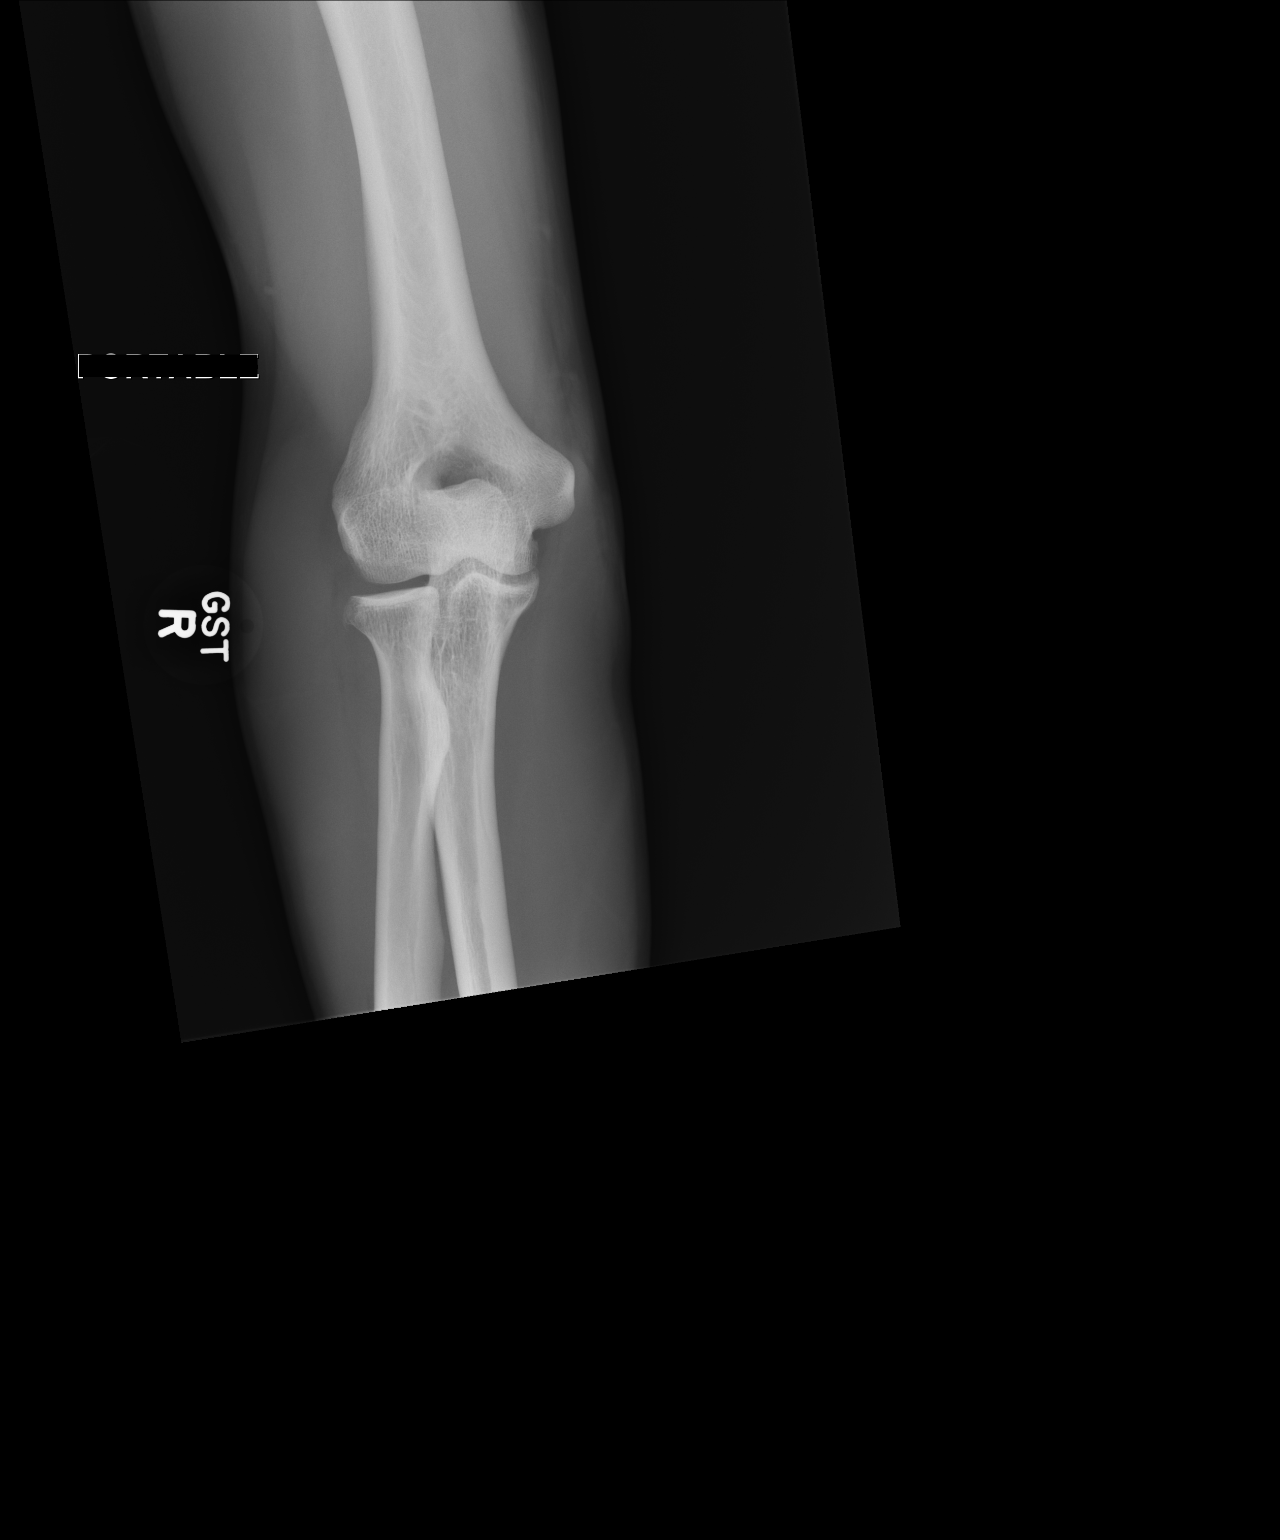

[lateral]
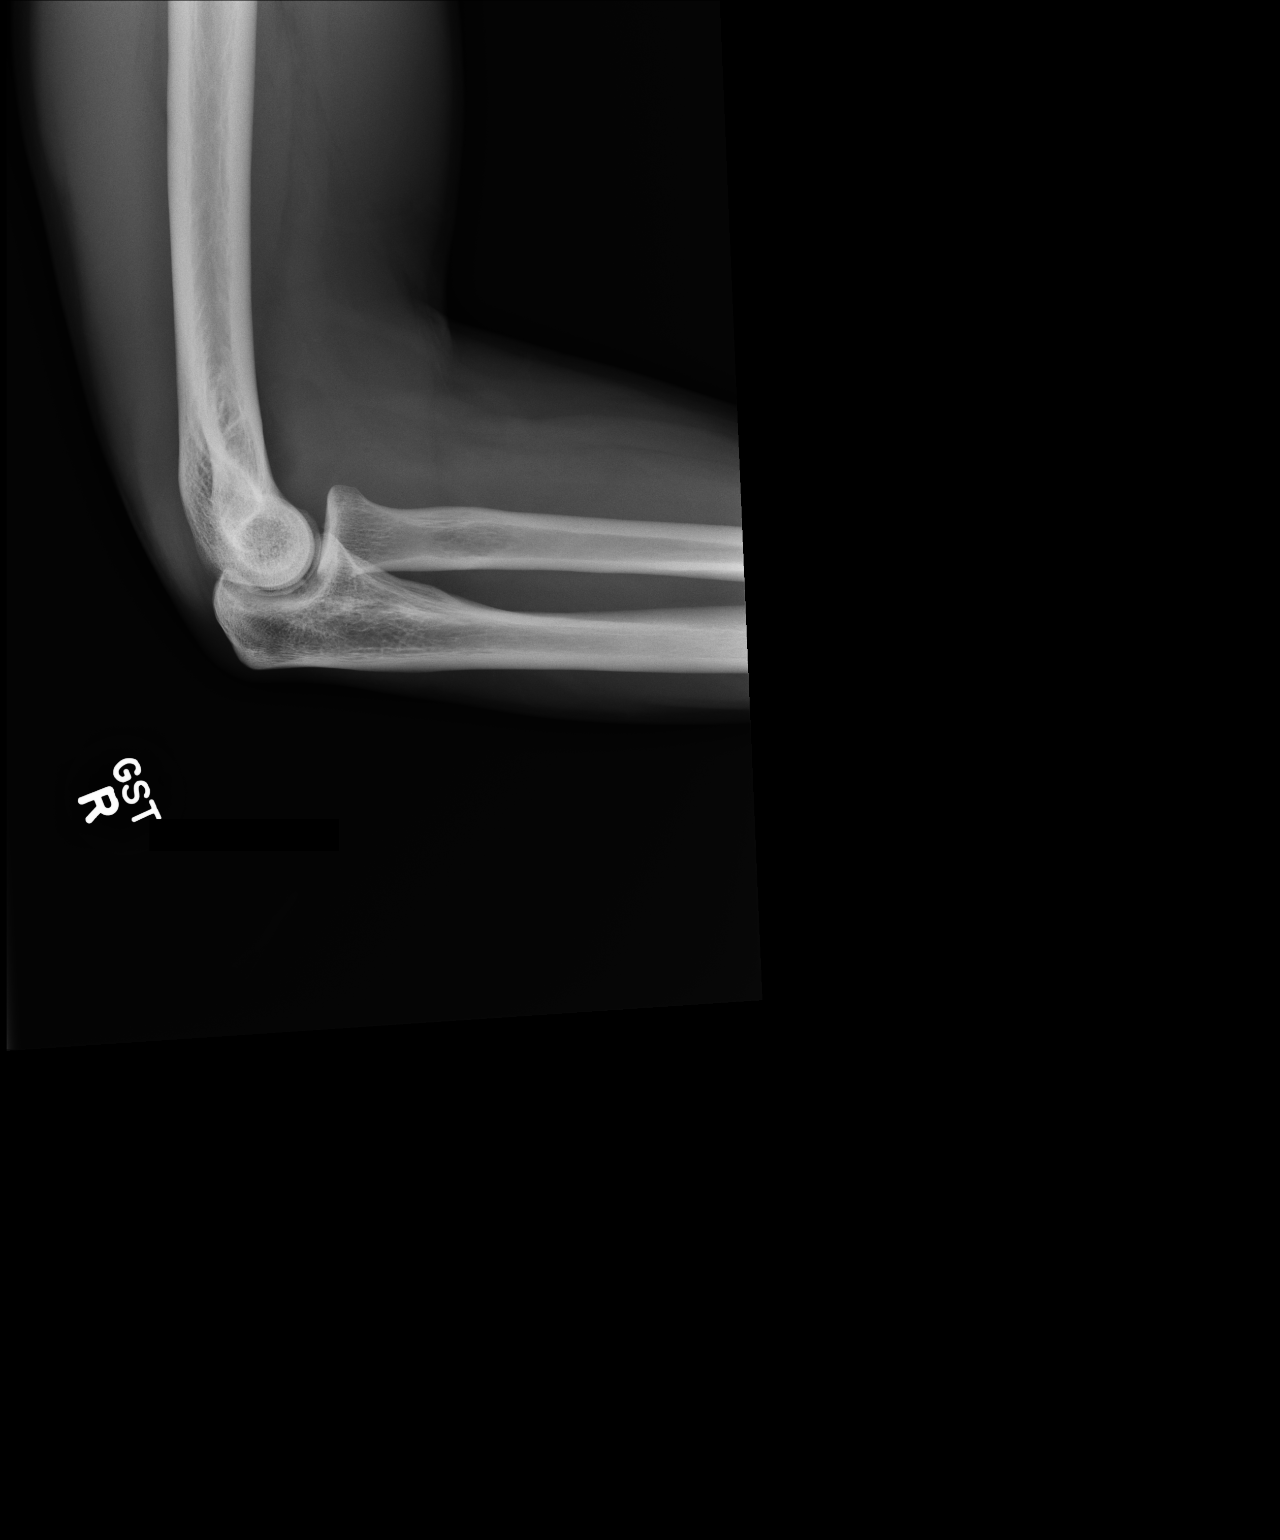

[2 of 2 positions shown; findings below may reference images not displayed]

FINDINGS: There has been interval reduction of the radius and ulna. The radial
head is somewhat more volarly positioned than expected, raising
question for underlying ligamentous or capsular injury. There also
appears to be a tiny fracture at the radial aspect of the radial
head.

An associated elbow joint effusion is seen. Mild soft tissue
swelling is noted about the elbow.
IMPRESSION: 1. Interval reduction of the radius and ulna. Radial head is
somewhat more volarly positioned than expected, raising question for
underlying ligamentous or capsular injury.
2. Tiny fracture at the radial aspect of the radial head.
3. Elbow joint effusion noted.

## 2018-12-30 ENCOUNTER — Other Ambulatory Visit: Payer: Self-pay

## 2018-12-30 DIAGNOSIS — Z20822 Contact with and (suspected) exposure to covid-19: Secondary | ICD-10-CM

## 2019-01-01 LAB — NOVEL CORONAVIRUS, NAA: SARS-CoV-2, NAA: NOT DETECTED

## 2022-01-29 ENCOUNTER — Ambulatory Visit: Payer: BC Managed Care – PPO | Admitting: Dermatology

## 2022-01-29 DIAGNOSIS — L578 Other skin changes due to chronic exposure to nonionizing radiation: Secondary | ICD-10-CM | POA: Diagnosis not present

## 2022-01-29 DIAGNOSIS — Z1283 Encounter for screening for malignant neoplasm of skin: Secondary | ICD-10-CM | POA: Diagnosis not present

## 2022-01-29 DIAGNOSIS — Z808 Family history of malignant neoplasm of other organs or systems: Secondary | ICD-10-CM

## 2022-01-29 DIAGNOSIS — D229 Melanocytic nevi, unspecified: Secondary | ICD-10-CM

## 2022-01-29 DIAGNOSIS — B079 Viral wart, unspecified: Secondary | ICD-10-CM

## 2022-01-29 DIAGNOSIS — L814 Other melanin hyperpigmentation: Secondary | ICD-10-CM

## 2022-01-29 DIAGNOSIS — L720 Epidermal cyst: Secondary | ICD-10-CM

## 2022-01-29 DIAGNOSIS — L821 Other seborrheic keratosis: Secondary | ICD-10-CM

## 2022-01-29 NOTE — Progress Notes (Signed)
New Patient Visit  Subjective  Brendan Fowler is a 46 y.o. male who presents for the following: Annual Exam (Warts on the toes - fhx of skin cancer in mother, unsure which type). The patient presents for Total-Body Skin Exam (TBSE) for skin cancer screening and mole check.  The patient has spots, moles and lesions to be evaluated, some may be new or changing.  The following portions of the chart were reviewed this encounter and updated as appropriate:   Tobacco  Allergies  Meds  Problems  Med Hx  Surg Hx  Fam Hx     Review of Systems:  No other skin or systemic complaints except as noted in HPI or Assessment and Plan.  Objective  Well appearing patient in no apparent distress; mood and affect are within normal limits.  A full examination was performed including scalp, head, eyes, ears, nose, lips, neck, chest, axillae, abdomen, back, buttocks, bilateral upper extremities, bilateral lower extremities, hands, feet, fingers, toes, fingernails, and toenails. All findings within normal limits unless otherwise noted below.  R dorsum great toe x 1, R sole of foot on great toe x 1 Verrucous papules -- Discussed viral etiology and contagion.   0.5 cm R dorsum great toe  0.6 cm R sole of the foot on the great toe  L forehead Subcutaneous nodule 0.5 cm.    Assessment & Plan  Viral warts, unspecified type R dorsum great toe x 1, R sole of foot on great toe x 1  Viral Wart (HPV) Counseling  Discussed viral / HPV (Human Papilloma Virus) etiology and risk of spread /infectivity to other areas of body as well as to other people.  Multiple treatments and methods may be required to clear warts and it is possible treatment may not be successful.  Treatment risks include discoloration; scarring and there is still potential for wart recurrence.  Destruction of lesion - R dorsum great toe x 1, R sole of foot on great toe x 1 Complexity: simple   Destruction method: cryotherapy   Informed  consent: discussed and consent obtained   Timeout:  patient name, date of birth, surgical site, and procedure verified Lesion destroyed using liquid nitrogen: Yes   Region frozen until ice ball extended beyond lesion: Yes   Outcome: patient tolerated procedure well with no complications   Post-procedure details: wound care instructions given    Destruction of lesion - R dorsum great toe x 1, R sole of foot on great toe x 1  Destruction method: chemical removal   Informed consent: discussed and consent obtained   Timeout:  patient name, date of birth, surgical site, and procedure verified Chemical destruction method: cantharidin   Chemical destruction method comment:  Squaric acid 3%, cantharidin plus Application time:  4 hours Procedure instructions: patient instructed to wash and dry area   Outcome: patient tolerated procedure well with no complications   Post-procedure details: wound care instructions given   Additional details:  Patient advised to set alarm to remind them to wash off with soap and water at the directed time.  Epidermal inclusion cyst L forehead  Benign-appearing. Exam most consistent with an epidermal inclusion cyst. Discussed that a cyst is a benign growth that can grow over time and sometimes get irritated or inflamed. Recommend observation if it is not bothersome. Discussed option of surgical excision to remove it if it is growing, symptomatic, or other changes noted. Please call for new or changing lesions so they can be evaluated.  Lentigines - Scattered tan macules - Due to sun exposure - Benign-appearing, observe - Recommend daily broad spectrum sunscreen SPF 30+ to sun-exposed areas, reapply every 2 hours as needed. - Call for any changes  Seborrheic Keratoses - Stuck-on, waxy, tan-brown papules and/or plaques  - Benign-appearing - Discussed benign etiology and prognosis. - Observe - Call for any changes  Melanocytic Nevi - Tan-brown and/or  pink-flesh-colored symmetric macules and papules - Benign appearing on exam today - Observation - Call clinic for new or changing moles - Recommend daily use of broad spectrum spf 30+ sunscreen to sun-exposed areas.   Hemangiomas - Red papules - Discussed benign nature - Observe - Call for any changes  Actinic Damage - Chronic condition, secondary to cumulative UV/sun exposure - diffuse scaly erythematous macules with underlying dyspigmentation - Recommend daily broad spectrum sunscreen SPF 30+ to sun-exposed areas, reapply every 2 hours as needed.  - Staying in the shade or wearing long sleeves, sun glasses (UVA+UVB protection) and wide brim hats (4-inch brim around the entire circumference of the hat) are also recommended for sun protection.  - Call for new or changing lesions.  Skin cancer screening performed today.  Return in about 2 years (around 01/30/2024) for TBSE.  Brendan Fowler, CMA, am acting as scribe for Sarina Ser, MD . Documentation: I have reviewed the above documentation for accuracy and completeness, and I agree with the above.  Sarina Ser, MD

## 2022-01-29 NOTE — Patient Instructions (Signed)
Due to recent changes in healthcare laws, you may see results of your pathology and/or laboratory studies on MyChart before the doctors have had a chance to review them. We understand that in some cases there may be results that are confusing or concerning to you. Please understand that not all results are received at the same time and often the doctors may need to interpret multiple results in order to provide you with the best plan of care or course of treatment. Therefore, we ask that you please give us 2 business days to thoroughly review all your results before contacting the office for clarification. Should we see a critical lab result, you will be contacted sooner.   If You Need Anything After Your Visit  If you have any questions or concerns for your doctor, please call our main line at [phone] and press option 4 to reach your doctor's medical assistant. If no one answers, please leave a voicemail as directed and we will return your call as soon as possible. Messages left after 4 pm will be answered the following business day.   You may also send us a message via MyChart. We typically respond to MyChart messages within 1-2 business days.  For prescription refills, please ask your pharmacy to contact our office. Our fax number is [phone].  If you have an urgent issue when the clinic is closed that cannot wait until the next business day, you can page your doctor at the number below.    Please note that while we do our best to be available for urgent issues outside of office hours, we are not available 24/7.   If you have an urgent issue and are unable to reach us, you may choose to seek medical care at your doctor's office, retail clinic, urgent care center, or emergency room.  If you have a medical emergency, please immediately call 911 or go to the emergency department.  Pager Numbers  - Dr. Kowalski: [phone]  - Dr. Moye: [phone]  - Dr. Stewart:  [phone]  In the event of inclement weather, please call our main line at [phone] for an update on the status of any delays or closures.  Dermatology Medication Tips: Please keep the boxes that topical medications come in in order to help keep track of the instructions about where and how to use these. Pharmacies typically print the medication instructions only on the boxes and not directly on the medication tubes.   If your medication is too expensive, please contact our office at [phone] option 4 or send us a message through MyChart.   We are unable to tell what your co-pay for medications will be in advance as this is different depending on your insurance coverage. However, we may be able to find a substitute medication at lower cost or fill out paperwork to get insurance to cover a needed medication.   If a prior authorization is required to get your medication covered by your insurance company, please allow us 1-2 business days to complete this process.  Drug prices often vary depending on where the prescription is filled and some pharmacies may offer cheaper prices.  The website www.goodrx.com contains coupons for medications through different pharmacies. The prices here do not account for what the cost may be with help from insurance (it may be cheaper with your insurance), but the website can give you the price if you did not use any insurance.  - You can print the associated coupon and take it with   your prescription to the pharmacy.  - You may also stop by our office during regular business hours and pick up a GoodRx coupon card.  - If you need your prescription sent electronically to a different pharmacy, notify our office through [facility] MyChart or by phone at [phone] option 4.     Si Usted Necesita Algo Despus de Su Visita  Tambin puede enviarnos un mensaje a travs de MyChart. Por lo general respondemos a los mensajes de MyChart en el transcurso de 1 a 2  das hbiles.  Para renovar recetas, por favor pida a su farmacia que se ponga en contacto con nuestra oficina. Nuestro nmero de fax es el [phone].  Si tiene un asunto urgente cuando la clnica est cerrada y que no puede esperar hasta el siguiente da hbil, puede llamar/localizar a su doctor(a) al nmero que aparece a continuacin.   Por favor, tenga en cuenta que aunque hacemos todo lo posible para estar disponibles para asuntos urgentes fuera del horario de oficina, no estamos disponibles las 24 horas del da, los 7 das de la semana.   Si tiene un problema urgente y no puede comunicarse con nosotros, puede optar por buscar atencin mdica  en el consultorio de su doctor(a), en una clnica privada, en un centro de atencin urgente o en una sala de emergencias.  Si tiene una emergencia mdica, por favor llame inmediatamente al 911 o vaya a la sala de emergencias.  Nmeros de bper  - Dr. Kowalski: [phone]  - Dra. Moye: [phone]  - Dra. Stewart: [phone]  En caso de inclemencias del tiempo, por favor llame a nuestra lnea principal al [phone] para una actualizacin sobre el estado de cualquier retraso o cierre.  Consejos para la medicacin en dermatologa: Por favor, guarde las cajas en las que vienen los medicamentos de uso tpico para ayudarle a seguir las instrucciones sobre dnde y cmo usarlos. Las farmacias generalmente imprimen las instrucciones del medicamento slo en las cajas y no directamente en los tubos del medicamento.   Si su medicamento es muy caro, por favor, pngase en contacto con nuestra oficina llamando al [phone] y presione la opcin 4 o envenos un mensaje a travs de MyChart.   No podemos decirle cul ser su copago por los medicamentos por adelantado ya que esto es diferente dependiendo de la cobertura de su seguro. Sin embargo, es posible que podamos encontrar un medicamento sustituto a menor costo o llenar un formulario para que el  seguro cubra el medicamento que se considera necesario.   Si se requiere una autorizacin previa para que su compaa de seguros cubra su medicamento, por favor permtanos de 1 a 2 das hbiles para completar este proceso.  Los precios de los medicamentos varan con frecuencia dependiendo del lugar de dnde se surte la receta y alguna farmacias pueden ofrecer precios ms baratos.  El sitio web www.goodrx.com tiene cupones para medicamentos de diferentes farmacias. Los precios aqu no tienen en cuenta lo que podra costar con la ayuda del seguro (puede ser ms barato con su seguro), pero el sitio web puede darle el precio si no utiliz ningn seguro.  - Puede imprimir el cupn correspondiente y llevarlo con su receta a la farmacia.  - Tambin puede pasar por nuestra oficina durante el horario de atencin regular y recoger una tarjeta de cupones de GoodRx.  - Si necesita que su receta se enve electrnicamente a una farmacia diferente, informe a nuestra oficina a travs de MyChart de [facility]   o por telfono llamando al [phone] y presione la opcin 4.  

## 2022-02-03 ENCOUNTER — Encounter: Payer: Self-pay | Admitting: Dermatology

## 2022-04-04 ENCOUNTER — Ambulatory Visit: Payer: BC Managed Care – PPO | Admitting: Dermatology

## 2022-04-19 ENCOUNTER — Ambulatory Visit: Payer: BC Managed Care – PPO | Admitting: Dermatology

## 2022-04-19 VITALS — BP 104/70

## 2022-04-19 DIAGNOSIS — B078 Other viral warts: Secondary | ICD-10-CM

## 2022-04-19 DIAGNOSIS — Z7189 Other specified counseling: Secondary | ICD-10-CM | POA: Diagnosis not present

## 2022-04-19 DIAGNOSIS — L820 Inflamed seborrheic keratosis: Secondary | ICD-10-CM

## 2022-04-19 NOTE — Progress Notes (Signed)
   Follow-Up Visit   Subjective  Brendan Fowler is a 47 y.o. male who presents for the following: Warts (Right dorsum great toe, right sole of foot at great toe - treated with LN2, Squaric acid and cantharadin) and Other (Spot on post neck).  The following portions of the chart were reviewed this encounter and updated as appropriate:   Tobacco  Allergies  Meds  Problems  Med Hx  Surg Hx  Fam Hx     Review of Systems:  No other skin or systemic complaints except as noted in HPI or Assessment and Plan.  Objective  Well appearing patient in no apparent distress; mood and affect are within normal limits.  A focused examination was performed including neck, right foot. Relevant physical exam findings are noted in the Assessment and Plan.  Left post neck Erythematous stuck-on, waxy papule or plaque  Right toe 0.4 x 0.7 cm verrucous papule of right medial great toe. Verrucous papule of right sole at great toe and right dorsum great toe   Assessment & Plan  Inflamed seborrheic keratosis Left post neck Destruction of lesion - Left post neck Complexity: simple   Destruction method: cryotherapy   Informed consent: discussed and consent obtained   Timeout:  patient name, date of birth, surgical site, and procedure verified Lesion destroyed using liquid nitrogen: Yes   Region frozen until ice ball extended beyond lesion: Yes   Outcome: patient tolerated procedure well with no complications   Post-procedure details: wound care instructions given    Other viral warts Viral Wart (HPV) Counseling  Discussed viral / HPV (Human Papilloma Virus) etiology and risk of spread /infectivity to other areas of body as well as to other people.  Multiple treatments and methods may be required to clear warts and it is possible treatment may not be successful.  Treatment risks include discoloration; scarring and there is still potential for wart recurrence.  Right toe Destruction of lesion - Right  toe Complexity: simple   Destruction method: cryotherapy   Informed consent: discussed and consent obtained   Timeout:  patient name, date of birth, surgical site, and procedure verified Lesion destroyed using liquid nitrogen: Yes   Region frozen until ice ball extended beyond lesion: Yes   Outcome: patient tolerated procedure well with no complications   Post-procedure details: wound care instructions given   Additional details:  Squaric Acid 3% applied to warts today. Prior to application reviewed risk of inflammation and irritation.   Destruction of lesion - Right toe  Destruction method: chemical removal   Informed consent: discussed and consent obtained   Timeout:  patient name, date of birth, surgical site, and procedure verified Chemical destruction method: cantharidin   Procedure instructions: patient instructed to wash and dry area   Outcome: patient tolerated procedure well with no complications   Post-procedure details: wound care instructions given    Return in about 2 months (around 06/18/2022) for Wart Follow up.  I, Ashok Cordia, CMA, am acting as scribe for Sarina Ser, MD . Documentation: I have reviewed the above documentation for accuracy and completeness, and I agree with the above.  Sarina Ser, MD

## 2022-04-19 NOTE — Patient Instructions (Addendum)
Cryotherapy Aftercare  Wash gently with soap and water everyday.   Apply Vaseline and Band-Aid daily until healed.    Instructions for After In-Office Application of Cantharidin  1. This is a strong medicine; please follow ALL instructions.  2. Gently wash off with soap and water in four hours or sooner s directed by your physician.  3. **WARNING** this medicine can cause severe blistering, blood blisters, infection, and/or scarring if it is not washed off as directed.  4. Your progress will be rechecked in 1-2 months; call sooner if there are any questions or problems.  Due to recent changes in healthcare laws, you may see results of your pathology and/or laboratory studies on MyChart before the doctors have had a chance to review them. We understand that in some cases there may be results that are confusing or concerning to you. Please understand that not all results are received at the same time and often the doctors may need to interpret multiple results in order to provide you with the best plan of care or course of treatment. Therefore, we ask that you please give [place] 2 business days to thoroughly review all your results before contacting the office for clarification. Should we see a critical lab result, you will be contacted sooner.   If You Need Anything After Your Visit  If you have any questions or concerns for your doctor, please call our main line at [phone] and press option 4 to reach your doctor's medical assistant. If no one answers, please leave a voicemail as directed and we will return your call as soon as possible. Messages left after 4 pm will be answered the following business day.   You may also send [place] a message via [place]. We typically respond to MyChart messages within 1-2 business days.  For prescription refills, please ask your pharmacy to contact our office. Our fax number is [phone].  If you have an urgent issue when the clinic is closed that cannot  wait until the next business day, you can page your doctor at the number below.    Please note that while we do our best to be available for urgent issues outside of office hours, we are not available 24/7.   If you have an urgent issue and are unable to reach [place], you may choose to seek medical care at your doctor's office, retail clinic, urgent care center, or emergency room.  If you have a medical emergency, please immediately call 911 or go to the emergency department.  Pager Numbers  - Dr. Nehemiah Massed: [phone]  - Dr. Laurence Ferrari: [phone]  - Dr. Nicole Kindred: [phone]  In the event of inclement weather, please call our main line at [phone] for an update on the status of any delays or closures.  Dermatology Medication Tips: Please keep the boxes that topical medications come in in order to help keep track of the instructions about where and how to use these. Pharmacies typically print the medication instructions only on the boxes and not directly on the medication tubes.   If your medication is too expensive, please contact our office at [phone] option 4 or send [place] a message through [place].   We are unable to tell what your co-pay for medications will be in advance as this is different depending on your insurance coverage. However, we may be able to find a substitute medication at lower cost or fill out paperwork to get insurance to cover a needed medication.   If a prior authorization  is required to get your medication covered by your insurance company, please allow [place] 1-2 business days to complete this process.  Drug prices often vary depending on where the prescription is filled and some pharmacies may offer cheaper prices.  The website www.goodrx.com contains coupons for medications through different pharmacies. The prices here do not account for what the cost may be with help from insurance (it may be cheaper with your insurance), but the website can give you the price  if you did not use any insurance.  - You can print the associated coupon and take it with your prescription to the pharmacy.  - You may also stop by our office during regular business hours and pick up a GoodRx coupon card.  - If you need your prescription sent electronically to a different pharmacy, notify our office through [facility] or by phone at [phone] option 4.     Si Usted Necesita Algo Despus de Su Visita  Tambin puede enviarnos un mensaje a travs de Pharmacist, community. Por lo general respondemos a los mensajes de MyChart en el transcurso de 1 a 2 das hbiles.  Para renovar recetas, por favor pida a su farmacia que se ponga en contacto con nuestra oficina. Harland Dingwall de fax es [place] [phone].  Si tiene un asunto urgente cuando la clnica est cerrada y que no puede esperar hasta el siguiente da hbil, puede llamar/localizar a su doctor(a) al nmero que aparece a continuacin.   Por favor, tenga en cuenta que aunque hacemos todo lo posible para estar disponibles para asuntos urgentes fuera del horario de [place], no estamos disponibles las 24 horas del da, los 7 das de la [place].   Si tiene un problema urgente y no puede comunicarse con nosotros, puede optar por buscar atencin mdica  en el consultorio de su doctor(a), en una clnica privada, en un centro de atencin urgente o en una sala de emergencias.  Si tiene Engineering geologist, por favor llame inmediatamente al 911 o vaya a la sala de emergencias.  Nmeros de bper  - Dr. Nehemiah Massed: [phone]  - Dra. Moye: [phone]  - Dra. Nicole Kindred: [phone]  En caso de inclemencias del [place], por favor llame a Johnsie Kindred principal al [phone] para una actualizacin sobre el [place] de cualquier retraso o cierre.  Consejos para la medicacin en dermatologa: Por favor, guarde las cajas en las que vienen los medicamentos de uso tpico para ayudarle a seguir las instrucciones sobre dnde y cmo usarlos.  Las farmacias generalmente imprimen las instrucciones del medicamento slo en las cajas y no directamente en los tubos del [place].   Si su medicamento es muy caro, por favor, pngase en contacto con Zigmund Daniel llamando al [phone] y presione la opcin 4 o envenos un mensaje a travs de Pharmacist, community.   No podemos decirle cul ser su copago por los medicamentos por adelantado ya que esto es diferente dependiendo de la cobertura de su seguro. Sin embargo, es posible que podamos encontrar un medicamento sustituto a Electrical engineer un formulario para que el seguro cubra el medicamento que se considera necesario.   Si se requiere una autorizacin previa para que su compaa de seguros [place] su medicamento, por favor permtanos de 1 a 2 das hbiles para completar este proceso.  Los precios de los medicamentos varan con frecuencia dependiendo del Environmental consultant de dnde se surte la receta y alguna farmacias pueden ofrecer precios ms baratos.  El sitio web www.goodrx.com tiene cupones para medicamentos  de [employer]. Los precios aqu no tienen en cuenta lo que podra costar con la ayuda del seguro (puede ser ms barato con su seguro), pero el sitio web puede darle el precio si no utiliz Research scientist (physical sciences).  - Puede imprimir el cupn correspondiente y llevarlo con su receta a la farmacia.  - Tambin puede pasar por nuestra oficina durante el horario de atencin regular y Charity fundraiser una tarjeta de cupones de GoodRx.  - Si necesita que su receta se enve electrnicamente a una farmacia diferente, informe a nuestra oficina a travs de MyChart de [place] o por telfono llamando al [phone] y presione la opcin 4.

## 2022-04-23 ENCOUNTER — Encounter: Payer: Self-pay | Admitting: Dermatology

## 2022-06-19 ENCOUNTER — Ambulatory Visit: Payer: BC Managed Care – PPO | Admitting: Dermatology

## 2022-08-29 ENCOUNTER — Ambulatory Visit: Payer: BC Managed Care – PPO | Admitting: Dermatology

## 2022-08-29 ENCOUNTER — Encounter: Payer: Self-pay | Admitting: Dermatology

## 2022-08-29 DIAGNOSIS — L708 Other acne: Secondary | ICD-10-CM

## 2022-08-29 DIAGNOSIS — Z7189 Other specified counseling: Secondary | ICD-10-CM | POA: Diagnosis not present

## 2022-08-29 DIAGNOSIS — R234 Changes in skin texture: Secondary | ICD-10-CM

## 2022-08-29 DIAGNOSIS — L738 Other specified follicular disorders: Secondary | ICD-10-CM

## 2022-08-29 DIAGNOSIS — L905 Scar conditions and fibrosis of skin: Secondary | ICD-10-CM | POA: Diagnosis not present

## 2022-08-29 DIAGNOSIS — L730 Acne keloid: Secondary | ICD-10-CM

## 2022-08-29 DIAGNOSIS — B078 Other viral warts: Secondary | ICD-10-CM

## 2022-08-29 NOTE — Patient Instructions (Addendum)
Cryotherapy Aftercare  Wash gently with soap and water everyday.   Apply Vaseline and Band-Aid daily until healed.      Instructions for After In-Office Application of Cantharidin  1. This is a strong medicine; please follow ALL instructions.  2. Gently wash off with soap and water in four hours or sooner s directed by your physician.  3. **WARNING** this medicine can cause severe blistering, blood blisters, infection, and/or scarring if it is not washed off as directed.  4. Your progress will be rechecked in 1-2 months; call sooner if there are any questions or problems.   Once Blisters Have Resolved: Start prescription 5-fluorouracil/salicylic acid wart paste from [facility] nightly under occlusion. Reviewed risk of irritation and risk scarring if applied to normal skin. If irritation develops, stop medication for a few days until area calm, then restart a very small amount just to the wart. This medication cannot be used by pregnant women. Patient advised they will receive an email from the [facility] pharmacy and can purchase the medication online through a link in the email.   Instructions for [facility] Medications  One or more of your medications was sent to the [facility] mail order compounding pharmacy. You will receive an email from them and can purchase the medicine through that link. It will then be mailed to your home at the address you confirmed. If for any reason you do not receive an email from them, please check your spam folder. If you still do not find the email, please let us know. [facility] phone number is [phone].       Due to recent changes in healthcare laws, you may see results of your pathology and/or laboratory studies on MyChart before the doctors have had a chance to review them. We understand that in some cases there may be results that are confusing or concerning to you. Please understand that not all results are received at  the same time and often the doctors may need to interpret multiple results in order to provide you with the best plan of care or course of treatment. Therefore, we ask that you please give [place] 2 business days to thoroughly review all your results before contacting the office for clarification. Should we see a critical lab result, you will be contacted sooner.   If You Need Anything After Your Visit  If you have any questions or concerns for your doctor, please call our main line at [phone] and press option 4 to reach your doctor's medical assistant. If no one answers, please leave a voicemail as directed and we will return your call as soon as possible. Messages left after 4 pm will be answered the following business day.   You may also send [place] a message via MyChart. We typically respond to MyChart messages within 1-2 business days.  For prescription refills, please ask your pharmacy to contact our office. Our fax number is [phone].  If you have an urgent issue when the clinic is closed that cannot wait until the next business day, you can page your doctor at the number below.    Please note that while we do our best to be available for urgent issues outside of office hours, we are not available 24/7.   If you have an urgent issue and are unable to reach [place], you may choose to seek medical care at your doctor's office, retail clinic, urgent care center, or emergency room.  If you have a medical emergency,  please immediately call 911 or go to the emergency department.  Pager Numbers  - Dr. Gwen Pounds: [phone]  - Dr. Neale Burly: [phone]  - Dr. Roseanne Reno: [phone]  In the event of inclement weather, please call our main line at [phone] for an update on the status of any delays or closures.  Dermatology Medication Tips: Please keep the boxes that topical medications come in in order to help keep track of the instructions about where and how to use these. Pharmacies  typically print the medication instructions only on the boxes and not directly on the medication tubes.   If your medication is too expensive, please contact our office at [phone] option 4 or send [place] a message through MyChart.   We are unable to tell what your co-pay for medications will be in advance as this is different depending on your insurance coverage. However, we may be able to find a substitute medication at lower cost or fill out paperwork to get insurance to cover a needed medication.   If a prior authorization is required to get your medication covered by your insurance company, please allow [place] 1-2 business days to complete this process.  Drug prices often vary depending on where the prescription is filled and some pharmacies may offer cheaper prices.  The website www.goodrx.com contains coupons for medications through different pharmacies. The prices here do not account for what the cost may be with help from insurance (it may be cheaper with your insurance), but the website can give you the price if you did not use any insurance.  - You can print the associated coupon and take it with your prescription to the pharmacy.  - You may also stop by our office during regular business hours and pick up a GoodRx coupon card.  - If you need your prescription sent electronically to a different pharmacy, notify our office through [facility] or by phone at [phone] option 4.     Si Usted Necesita Algo Despus de Su Visita  Tambin puede enviarnos un mensaje a travs de Clinical cytogeneticist. Por lo general respondemos a los mensajes de MyChart en el transcurso de 1 a 2 das hbiles.  Para renovar recetas, por favor pida a su farmacia que se ponga en contacto con nuestra oficina. Annie Sable de fax es [place] [phone].  Si tiene un asunto urgente cuando la clnica est cerrada y que no puede esperar hasta el siguiente da hbil, puede llamar/localizar a su doctor(a) al nmero que  aparece a continuacin.   Por favor, tenga en cuenta que aunque hacemos todo lo posible para estar disponibles para asuntos urgentes fuera del horario de [place], no estamos disponibles las 24 horas del da, los 7 [address]  [address] de la [place].   Si tiene un problema urgente y no puede comunicarse con nosotros, puede optar por buscar atencin mdica  en el consultorio de su doctor(a), en una clnica privada, en un centro de atencin urgente o en una sala de emergencias.  Si tiene Engineer, drilling, por favor llame inmediatamente al 911 o vaya a la sala de emergencias.  Nmeros de bper  - Dr. Gwen Pounds: [phone]  - Dra. Moye: [phone]  - Dra. Roseanne Reno: [phone]  En caso de inclemencias del [place], por favor llame a Lacy Duverney principal al [phone] para una actualizacin sobre el [place] de cualquier retraso o cierre.  Consejos para la medicacin en dermatologa: Por favor, guarde las cajas en las que vienen los medicamentos de uso tpico para ayudarle a seguir  las instrucciones sobre dnde y cmo usarlos. Las farmacias generalmente imprimen las instrucciones del medicamento slo en las cajas y no directamente en los tubos del [place].   Si su medicamento es muy caro, por favor, pngase en contacto con Rolm Gala llamando al [phone] y presione la opcin 4 o envenos un mensaje a travs de Clinical cytogeneticist.   No podemos decirle cul ser su copago por los medicamentos por adelantado ya que esto es diferente dependiendo de la cobertura de su seguro. Sin embargo, es posible que podamos encontrar un medicamento sustituto a Audiological scientist un formulario para que el seguro cubra el medicamento que se considera necesario.   Si se requiere una autorizacin previa para que su compaa de seguros [place] su medicamento, por favor permtanos de 1 a 2 das hbiles para completar [address]  Los precios de los medicamentos varan con frecuencia dependiendo del Environmental consultant de dnde se surte  la receta y alguna farmacias pueden ofrecer precios ms baratos.  El sitio web www.goodrx.com tiene cupones para medicamentos de Health and safety inspector. Los precios aqu no tienen en cuenta lo que podra costar con la ayuda del seguro (puede ser ms barato con su seguro), pero el sitio web puede darle el precio si no utiliz Tourist information centre manager.  - Puede imprimir el cupn correspondiente y llevarlo con su receta a la farmacia.  - Tambin puede pasar por nuestra oficina durante el horario de atencin regular y Education officer, museum una tarjeta de cupones de GoodRx.  - Si necesita que su receta se enve electrnicamente a una farmacia diferente, informe a nuestra oficina a travs de MyChart de [place] o por telfono llamando al [phone] y presione la opcin 4.

## 2022-08-29 NOTE — Progress Notes (Signed)
Follow-Up Visit   Subjective  Brendan Fowler is a 47 y.o. male who presents for the following: Wart follow up. 4 months. Tx with LN2, Cantharone plus and Squaric acid. Reports exuberant reaction to squaric acid to inner upper arm. Spread to chest. Happened 2 weeks later. Check face. Discuss acne scarring. No previous treatment. Has taken Isotretinoin as a teenager. Feels as if he was "under-dosed".  Wife with patient.   The following portions of the chart were reviewed this encounter and updated as appropriate: medications, allergies, medical history  Review of Systems:  No other skin or systemic complaints except as noted in HPI or Assessment and Plan.  Objective  Well appearing patient in no apparent distress; mood and affect are within normal limits.  Areas Examined: Face, right great toe.  Relevant physical exam findings are noted in the Assessment and Plan.  right plantar great toe x1, right lateral great toe x1 (2) 0.6 x 0.3 cm verrucous papule at right medial great toe. 0.7 x 0.4 cm verrucous papule plantar right great toe. 3 mm verrucous papule at    Assessment & Plan   Other viral warts (2) right plantar great toe x1, right lateral great toe x1  Viral Wart (HPV) Counseling  Discussed viral / HPV (Human Papilloma Virus) etiology and risk of spread /infectivity to other areas of body as well as to other people.  Multiple treatments and methods may be required to clear warts and it is possible treatment may not be successful.  Treatment risks include discoloration; scarring and there is still potential for wart recurrence.  Destruction of lesion - right plantar great toe x1, right lateral great toe x1 Complexity: simple   Destruction method: cryotherapy   Informed consent: discussed and consent obtained   Timeout:  patient name, date of birth, surgical site, and procedure verified Lesion destroyed using liquid nitrogen: Yes   Region frozen until ice ball extended  beyond lesion: Yes   Outcome: patient tolerated procedure well with no complications   Post-procedure details: wound care instructions given   Additional details:  Prior to procedure, discussed risks of blister formation, small wound, skin dyspigmentation, or rare scar following cryotherapy. Recommend Vaseline ointment to treated areas while healing.    SCARRING Exam: Pitted scarring at mid face x 2, secondary to acne.  Benign-appearing.  Observation.  Call clinic for new or changing lesions. Recommend daily broad spectrum sunscreen SPF 30+, reapply every 2 hours as needed. Treatment: Recommend Serica moisturizing scar formula cream every night or [facility] brand or Mederma silicone scar sheet every night for the first year after a scar appears to help with scar remodeling if desired. Scars remodel on their own for a full year and will gradually improve in appearance over time.  Treatment:  Discussed Isotretinoin therapy to reduce oil in face and shrink pores.  Patient will think about.  Recommend using OC8 for oiliness.  He may get online. Discussed chemical peel for spot treatment of scars versus laser versus punch excision.  Patient declines at this time.  Sebaceous Hyperplasia with oily skin - Small yellow papules with a central dell - Benign-appearing - Observe. Call for changes.  -Discussed isotretinoin versus OC 8 Patient was on low-dose isotretinoin for short period of time in his teenage years.  Return in about 3 months (around 11/29/2022) for Wart Follow UP.  I, Lawson Radar, CMA, am acting as scribe for Armida Sans, MD.  Documentation: I have reviewed the above documentation for accuracy and  completeness, and I agree with the above.  Sarina Ser, MD

## 2022-08-31 ENCOUNTER — Encounter: Payer: Self-pay | Admitting: Dermatology

## 2022-12-11 ENCOUNTER — Ambulatory Visit: Payer: BC Managed Care – PPO | Admitting: Dermatology

## 2023-05-09 ENCOUNTER — Ambulatory Visit: Payer: 59 | Admitting: Dermatology

## 2024-04-22 ENCOUNTER — Other Ambulatory Visit (HOSPITAL_BASED_OUTPATIENT_CLINIC_OR_DEPARTMENT_OTHER): Payer: Self-pay | Admitting: Family Medicine

## 2024-04-22 DIAGNOSIS — E7800 Pure hypercholesterolemia, unspecified: Secondary | ICD-10-CM
